# Patient Record
Sex: Male | Born: 1990
Health system: Southern US, Community
[De-identification: ages and names within clinical notes are randomized; demographics above are authoritative.]

## PROBLEM LIST (undated history)

## (undated) DIAGNOSIS — S0990XA Unspecified injury of head, initial encounter: Secondary | ICD-10-CM

## (undated) DIAGNOSIS — G43909 Migraine, unspecified, not intractable, without status migrainosus: Secondary | ICD-10-CM

---
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
## 2012-10-23 ENCOUNTER — Emergency Department (HOSPITAL_BASED_OUTPATIENT_CLINIC_OR_DEPARTMENT_OTHER)
Admission: EM | Admit: 2012-10-23 | Discharge: 2012-10-23 | Disposition: A | Discharge status: Home / Self Care | Payer: Self-pay | Attending: Emergency Medicine | Admitting: Emergency Medicine

## 2012-10-23 ENCOUNTER — Encounter (HOSPITAL_BASED_OUTPATIENT_CLINIC_OR_DEPARTMENT_OTHER): Payer: Self-pay | Admitting: *Deleted

## 2012-10-23 DIAGNOSIS — Z87891 Personal history of nicotine dependence: Secondary | ICD-10-CM | POA: Insufficient documentation

## 2012-10-23 DIAGNOSIS — R51 Headache: Secondary | ICD-10-CM | POA: Insufficient documentation

## 2012-10-23 DIAGNOSIS — Z8679 Personal history of other diseases of the circulatory system: Secondary | ICD-10-CM | POA: Insufficient documentation

## 2012-10-23 HISTORY — DX: Migraine, unspecified, not intractable, without status migrainosus: G43.909
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
## 2012-10-23 NOTE — ED Notes (Signed)
Pt states he has a long history of headaches, hx of migraines, for the last two weeks he has had a headache every day.  Denies a headache at this time.  Describes the pain as beginning in his neck and radiating over head into his eyes.  Excederin does not work.  Took Ibuprofen 800mg  with relief.

## 2012-10-23 NOTE — ED Notes (Signed)
Pt states when he has his headaches, he has a sensitivity to light. Pt states that they come and go. Sometimes he is able to go to sleep and the headaches goes away, however other times it does not. Pt wants to be evaluated to make sure nothing serious is going on with his head/brain.

## 2013-01-15 ENCOUNTER — Emergency Department (HOSPITAL_BASED_OUTPATIENT_CLINIC_OR_DEPARTMENT_OTHER)
Admission: EM | Admit: 2013-01-15 | Discharge: 2013-01-15 | Disposition: A | Discharge status: Home / Self Care | Payer: Self-pay | Attending: Emergency Medicine | Admitting: Emergency Medicine

## 2013-01-15 ENCOUNTER — Encounter (HOSPITAL_BASED_OUTPATIENT_CLINIC_OR_DEPARTMENT_OTHER): Payer: Self-pay | Admitting: *Deleted

## 2013-01-15 DIAGNOSIS — Z87891 Personal history of nicotine dependence: Secondary | ICD-10-CM | POA: Insufficient documentation

## 2013-01-15 DIAGNOSIS — X12XXXA Contact with other hot fluids, initial encounter: Secondary | ICD-10-CM | POA: Insufficient documentation

## 2013-01-15 DIAGNOSIS — Y939 Activity, unspecified: Secondary | ICD-10-CM | POA: Insufficient documentation

## 2013-01-15 DIAGNOSIS — T23271A Burn of second degree of right wrist, initial encounter: Secondary | ICD-10-CM

## 2013-01-15 DIAGNOSIS — Z8679 Personal history of other diseases of the circulatory system: Secondary | ICD-10-CM | POA: Insufficient documentation

## 2013-01-15 DIAGNOSIS — Y929 Unspecified place or not applicable: Secondary | ICD-10-CM | POA: Insufficient documentation

## 2013-01-15 DIAGNOSIS — T23279A Burn of second degree of unspecified wrist, initial encounter: Secondary | ICD-10-CM | POA: Insufficient documentation

## 2013-01-15 MED ORDER — HYDROCODONE-ACETAMINOPHEN 5-325 MG PO TABS: 2.0000 | ORAL_TABLET | ORAL | Status: DC | PRN

## 2013-01-15 MED ORDER — SILVER SULFADIAZINE 1 % EX CREA
TOPICAL_CREAM | Freq: Every day | CUTANEOUS | Status: DC
  Administered 2013-01-15: 17:00:00 via TOPICAL
  Filled 2013-01-15: qty 85
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
## 2013-01-15 NOTE — ED Provider Notes (Signed)
Medical screening examination/treatment/procedure(s) were performed by non-physician practitioner and as supervising physician I was immediately available for consultation/collaboration.   Andrian Urbach, MD 01/15/13 2322 
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
## 2013-01-16 ENCOUNTER — Emergency Department (HOSPITAL_BASED_OUTPATIENT_CLINIC_OR_DEPARTMENT_OTHER)
Admission: EM | Admit: 2013-01-16 | Discharge: 2013-01-16 | Disposition: A | Discharge status: Home / Self Care | Payer: Self-pay | Attending: Emergency Medicine | Admitting: Emergency Medicine

## 2013-01-16 ENCOUNTER — Encounter (HOSPITAL_BASED_OUTPATIENT_CLINIC_OR_DEPARTMENT_OTHER): Payer: Self-pay | Admitting: *Deleted

## 2013-01-16 DIAGNOSIS — X58XXXA Exposure to other specified factors, initial encounter: Secondary | ICD-10-CM | POA: Insufficient documentation

## 2013-01-16 DIAGNOSIS — Z87891 Personal history of nicotine dependence: Secondary | ICD-10-CM | POA: Insufficient documentation

## 2013-01-16 DIAGNOSIS — Y929 Unspecified place or not applicable: Secondary | ICD-10-CM | POA: Insufficient documentation

## 2013-01-16 DIAGNOSIS — Z8679 Personal history of other diseases of the circulatory system: Secondary | ICD-10-CM | POA: Insufficient documentation

## 2013-01-16 DIAGNOSIS — Y939 Activity, unspecified: Secondary | ICD-10-CM | POA: Insufficient documentation

## 2013-01-16 DIAGNOSIS — T23301S Burn of third degree of right hand, unspecified site, sequela: Secondary | ICD-10-CM

## 2013-01-16 DIAGNOSIS — T23309A Burn of third degree of unspecified hand, unspecified site, initial encounter: Secondary | ICD-10-CM | POA: Insufficient documentation

## 2013-01-16 MED ORDER — SILVER SULFADIAZINE 1 % EX CREA: TOPICAL_CREAM | Freq: Every day | CUTANEOUS | Status: DC
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
## 2013-01-21 ENCOUNTER — Encounter (HOSPITAL_BASED_OUTPATIENT_CLINIC_OR_DEPARTMENT_OTHER): Payer: Self-pay | Admitting: *Deleted

## 2013-01-21 ENCOUNTER — Emergency Department (HOSPITAL_BASED_OUTPATIENT_CLINIC_OR_DEPARTMENT_OTHER)
Admission: EM | Admit: 2013-01-21 | Discharge: 2013-01-21 | Disposition: A | Discharge status: Home / Self Care | Payer: Self-pay | Attending: Emergency Medicine | Admitting: Emergency Medicine

## 2013-01-21 DIAGNOSIS — T300 Burn of unspecified body region, unspecified degree: Secondary | ICD-10-CM

## 2013-01-21 DIAGNOSIS — X19XXXA Contact with other heat and hot substances, initial encounter: Secondary | ICD-10-CM | POA: Insufficient documentation

## 2013-01-21 DIAGNOSIS — T22219A Burn of second degree of unspecified forearm, initial encounter: Secondary | ICD-10-CM | POA: Insufficient documentation

## 2013-01-21 DIAGNOSIS — Y9389 Activity, other specified: Secondary | ICD-10-CM | POA: Insufficient documentation

## 2013-01-21 DIAGNOSIS — Y929 Unspecified place or not applicable: Secondary | ICD-10-CM | POA: Insufficient documentation

## 2013-01-21 DIAGNOSIS — Z87891 Personal history of nicotine dependence: Secondary | ICD-10-CM | POA: Insufficient documentation

## 2013-01-21 DIAGNOSIS — T310 Burns involving less than 10% of body surface: Secondary | ICD-10-CM | POA: Insufficient documentation

## 2013-01-21 DIAGNOSIS — Z8679 Personal history of other diseases of the circulatory system: Secondary | ICD-10-CM | POA: Insufficient documentation

## 2013-01-21 MED ORDER — SILVER SULFADIAZINE 1 % EX CREA
TOPICAL_CREAM | Freq: Once | CUTANEOUS | Status: DC
  Filled 2013-01-21: qty 85

## 2013-01-21 MED ORDER — HYDROCODONE-ACETAMINOPHEN 5-325 MG PO TABS: ORAL_TABLET | ORAL | Status: DC
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
## 2013-01-21 NOTE — ED Provider Notes (Signed)
  Medical screening examination/treatment/procedure(s) were performed by non-physician practitioner and as supervising physician I was immediately available for consultation/collaboration.    Gerhard Munch, MD 01/21/13 1447

## 2013-05-18 ENCOUNTER — Encounter (HOSPITAL_BASED_OUTPATIENT_CLINIC_OR_DEPARTMENT_OTHER): Payer: Self-pay | Admitting: Emergency Medicine

## 2013-05-18 ENCOUNTER — Emergency Department (HOSPITAL_BASED_OUTPATIENT_CLINIC_OR_DEPARTMENT_OTHER)
Admission: EM | Admit: 2013-05-18 | Discharge: 2013-05-18 | Disposition: A | Discharge status: Home / Self Care | Payer: Self-pay | Attending: Emergency Medicine | Admitting: Emergency Medicine

## 2013-05-18 DIAGNOSIS — M542 Cervicalgia: Secondary | ICD-10-CM | POA: Insufficient documentation

## 2013-05-18 DIAGNOSIS — F17200 Nicotine dependence, unspecified, uncomplicated: Secondary | ICD-10-CM | POA: Insufficient documentation

## 2013-05-18 DIAGNOSIS — Z8679 Personal history of other diseases of the circulatory system: Secondary | ICD-10-CM | POA: Insufficient documentation

## 2013-05-18 MED ORDER — CYCLOBENZAPRINE HCL 10 MG PO TABS: 10.0000 mg | ORAL_TABLET | Freq: Two times a day (BID) | ORAL | Status: DC | PRN

## 2013-05-18 MED ORDER — IBUPROFEN 800 MG PO TABS: 800.0000 mg | ORAL_TABLET | Freq: Three times a day (TID) | ORAL | Status: DC

## 2013-05-18 NOTE — ED Provider Notes (Signed)
Medical screening examination/treatment/procedure(s) were performed by non-physician practitioner and as supervising physician I was immediately available for consultation/collaboration.  EKG Interpretation   None         Casy Brunetto B. Bernette Mayers, MD 05/18/13 [phone]

## 2013-05-18 NOTE — ED Provider Notes (Signed)
CSN: [id number]     Arrival date & time 05/18/13  1138 History   First MD Initiated Contact with Patient 05/18/13 1156     Chief Complaint  Patient presents with  . Fever   (Consider location/radiation/quality/duration/timing/severity/associated sxs/prior Treatment) HPI Comments: He presents for evaluation of neck pain that started 3 days ago. He reports having a fever 3 days ago but none since. No nausea, vomiting, diarrhea. He is not aware of any injury to the neck. He has a history of recurrent migraine headaches but that current symptoms are not like his headache. Other than neck discomfort, he has no headache, sinus congestion, sore throat or cough.  The history is provided by the patient. No language interpreter was used.    Past Medical History  Diagnosis Date  . Migraine    History reviewed. No pertinent past surgical history. No family history on file. History  Substance Use Topics  . Smoking status: Current Every Day Smoker  . Smokeless tobacco: Never Used  . Alcohol Use: No    Review of Systems  Constitutional: Positive for fever.  HENT: Negative for congestion.   Eyes: Negative for visual disturbance.  Respiratory: Negative for cough and shortness of breath.   Cardiovascular: Negative for chest pain.  Gastrointestinal: Negative for nausea and vomiting.  Musculoskeletal: Positive for neck pain. Negative for myalgias.  Skin: Negative for rash.  Neurological: Negative for headaches.    Allergies  Review of patient's allergies indicates no known allergies.  Home Medications   Current Outpatient Rx  Name  Route  Sig  Dispense  Refill  . HYDROcodone-acetaminophen (NORCO/VICODIN) 5-325 MG per tablet      Take 1-2 tablets every 6 hours as needed for severe pain   8 tablet   0   . ibuprofen (ADVIL,MOTRIN) 800 MG tablet   Oral   Take 800 mg by mouth every 8 (eight) hours as needed for pain.         . silver sulfADIAZINE (SILVADENE) 1 % cream   Topical  Apply topically daily.   50 g   0    BP 119/75  Pulse 74  Temp(Src) 98.7 F (37.1 C) (Oral)  Resp 16  Ht 5\' 5"  (1.651 m)  Wt 165 lb (74.844 kg)  BMI 27.46 kg/m2  SpO2 100% Physical Exam  Constitutional: He is oriented to person, place, and time. He appears well-developed and well-nourished.  HENT:  Head: Normocephalic and atraumatic.  Eyes: EOM are normal. Pupils are equal, round, and reactive to light.  Neck: Normal range of motion.  Cardiovascular: Normal rate and regular rhythm.   No murmur heard. Pulmonary/Chest: Effort normal and breath sounds normal. He has no wheezes. He has no rales.  Abdominal: Soft. There is no tenderness.  Musculoskeletal:  Midline, lower cervical tenderness without paracervical tenderness or swelling. FROM.   Neurological: He is alert and oriented to person, place, and time. He has normal strength and normal reflexes. No sensory deficit. He displays a negative Romberg sign.  No meningeal signs.  Skin: Skin is warm and dry.  Psychiatric: He has a normal mood and affect.    ED Course  Procedures (including critical care time) Labs Review Labs Reviewed - No data to display Imaging Review No results found.  EKG Interpretation   None       MDM  No diagnosis found. 1. Muscular neck pain  He has reproducible tenderness to neck and no meningeal signs or signs of illness. VSS. Stable for discharge.  Arnoldo Hooker, PA-C 05/18/13 1235

## 2013-05-18 NOTE — ED Notes (Signed)
Intermittent fever, HA, neck pain since 11/15

## 2014-07-19 ENCOUNTER — Emergency Department (HOSPITAL_BASED_OUTPATIENT_CLINIC_OR_DEPARTMENT_OTHER)
Admission: EM | Admit: 2014-07-19 | Discharge: 2014-07-20 | Disposition: A | Discharge status: Home / Self Care | Payer: Self-pay | Attending: Emergency Medicine | Admitting: Emergency Medicine

## 2014-07-19 ENCOUNTER — Encounter (HOSPITAL_BASED_OUTPATIENT_CLINIC_OR_DEPARTMENT_OTHER): Payer: Self-pay | Admitting: Emergency Medicine

## 2014-07-19 DIAGNOSIS — Z794 Long term (current) use of insulin: Secondary | ICD-10-CM | POA: Insufficient documentation

## 2014-07-19 DIAGNOSIS — Z87828 Personal history of other (healed) physical injury and trauma: Secondary | ICD-10-CM | POA: Insufficient documentation

## 2014-07-19 DIAGNOSIS — Z79899 Other long term (current) drug therapy: Secondary | ICD-10-CM | POA: Insufficient documentation

## 2014-07-19 DIAGNOSIS — G43909 Migraine, unspecified, not intractable, without status migrainosus: Secondary | ICD-10-CM | POA: Insufficient documentation

## 2014-07-19 DIAGNOSIS — Z791 Long term (current) use of non-steroidal anti-inflammatories (NSAID): Secondary | ICD-10-CM | POA: Insufficient documentation

## 2014-07-19 DIAGNOSIS — G2571 Drug induced akathisia: Secondary | ICD-10-CM | POA: Insufficient documentation

## 2014-07-19 DIAGNOSIS — Z720 Tobacco use: Secondary | ICD-10-CM | POA: Insufficient documentation

## 2014-07-19 DIAGNOSIS — T450X5A Adverse effect of antiallergic and antiemetic drugs, initial encounter: Secondary | ICD-10-CM | POA: Insufficient documentation

## 2014-07-19 HISTORY — DX: Unspecified injury of head, initial encounter: S09.90XA

## 2014-07-19 MED ORDER — SODIUM CHLORIDE 0.9 % IV BOLUS (SEPSIS)
1000.0000 mL | Freq: Once | INTRAVENOUS | Status: AC
  Administered 2014-07-19: 1000 mL via INTRAVENOUS

## 2014-07-19 MED ORDER — KETOROLAC TROMETHAMINE 30 MG/ML IJ SOLN
30.0000 mg | Freq: Once | INTRAMUSCULAR | Status: AC
  Administered 2014-07-19: 30 mg via INTRAVENOUS
  Filled 2014-07-19: qty 1

## 2014-07-19 MED ORDER — NAPROXEN 500 MG PO TABS: 500.0000 mg | ORAL_TABLET | Freq: Two times a day (BID) | ORAL | Status: DC | PRN

## 2014-07-19 MED ORDER — DIPHENHYDRAMINE HCL 25 MG PO TABS: 25.0000 mg | ORAL_TABLET | Freq: Every evening | ORAL | Status: DC | PRN

## 2014-07-19 MED ORDER — ONDANSETRON 4 MG PO TBDP: 4.0000 mg | ORAL_TABLET | Freq: Three times a day (TID) | ORAL | Status: DC | PRN

## 2014-07-19 MED ORDER — LORAZEPAM 2 MG/ML IJ SOLN
1.0000 mg | INTRAMUSCULAR | Status: DC | PRN
  Administered 2014-07-19: 1 mg via INTRAVENOUS
  Filled 2014-07-19: qty 1

## 2014-07-19 MED ORDER — METOCLOPRAMIDE HCL 5 MG/ML IJ SOLN
10.0000 mg | Freq: Once | INTRAMUSCULAR | Status: AC
  Administered 2014-07-19: 10 mg via INTRAVENOUS
  Filled 2014-07-19: qty 2

## 2014-07-19 MED ORDER — DIPHENHYDRAMINE HCL 50 MG/ML IJ SOLN
25.0000 mg | Freq: Once | INTRAMUSCULAR | Status: AC
  Administered 2014-07-19: 25 mg via INTRAVENOUS
  Filled 2014-07-19: qty 1

## 2014-07-19 NOTE — Discharge Instructions (Signed)

## 2014-07-19 NOTE — ED Provider Notes (Signed)
CSN: [id number]     Arrival date & time 07/19/14  2133 History  This chart was scribed for Rolland Porter, MD by Evon Slack, ED Scribe. This patient was seen in room MH12/MH12 and the patient's care was started at 10:11 PM.     Chief Complaint  Patient presents with  . vomiting    . Headache   Patient is a 24 y.o. male presenting with headaches. The history is provided by the patient. No language interpreter was used.  Headache Associated symptoms: no abdominal pain, no cough, no diarrhea, no dizziness, no fatigue, no fever, no nausea, no sore throat and no vomiting    HPI Comments: Walter Owens is a 24 y.o. male with PMHx of migraine and Head injury who presents to the Emergency Department complaining of recurrent sharp throbbing HA onset today at 2 Pm. Pt states he has associated nausea, vomiting and neck stiffness. Pt states that he began vomiting after eating some meatloaf. Pt states that he tried taking a nap and benadryl that provided no relief. Pt denies fever, sore throat or cough  Past Medical History  Diagnosis Date  . Migraine   . Head injury    History reviewed. No pertinent past surgical history. History reviewed. No pertinent family history. History  Substance Use Topics  . Smoking status: Current Every Day Smoker  . Smokeless tobacco: Never Used  . Alcohol Use: No    Review of Systems  Constitutional: Negative for fever, chills, diaphoresis, appetite change and fatigue.  HENT: Negative for mouth sores, sore throat and trouble swallowing.   Eyes: Negative for visual disturbance.  Respiratory: Negative for cough, chest tightness, shortness of breath and wheezing.   Cardiovascular: Negative for chest pain.  Gastrointestinal: Negative for nausea, vomiting, abdominal pain, diarrhea and abdominal distention.  Endocrine: Negative for polydipsia, polyphagia and polyuria.  Genitourinary: Negative for dysuria, frequency and hematuria.  Musculoskeletal: Negative for  gait problem.  Skin: Negative for color change, pallor and rash.  Neurological: Positive for headaches. Negative for dizziness, syncope and light-headedness.  Hematological: Does not bruise/bleed easily.  Psychiatric/Behavioral: Negative for behavioral problems and confusion.      Allergies  Review of patient's allergies indicates no known allergies.  Home Medications   Prior to Admission medications   Medication Sig Start Date End Date Taking? Authorizing Provider  cyclobenzaprine (FLEXERIL) 10 MG tablet Take 1 tablet (10 mg total) by mouth 2 (two) times daily as needed for muscle spasms. 05/18/13   Shari A Upstill, PA-C  diphenhydrAMINE (BENADRYL) 25 MG tablet Take 1 tablet (25 mg total) by mouth at bedtime as needed (With Zofran and naproxen as needed for migraine). 07/19/14   Rolland Porter, MD  HYDROcodone-acetaminophen (NORCO/VICODIN) 5-325 MG per tablet Take 1-2 tablets every 6 hours as needed for severe pain 01/21/13   Renne Crigler, PA-C  ibuprofen (ADVIL,MOTRIN) 800 MG tablet Take 800 mg by mouth every 8 (eight) hours as needed for pain.    Historical Provider, MD  ibuprofen (ADVIL,MOTRIN) 800 MG tablet Take 1 tablet (800 mg total) by mouth 3 (three) times daily. 05/18/13   Shari A Upstill, PA-C  naproxen (NAPROSYN) 500 MG tablet Take 1 tablet (500 mg total) by mouth 2 (two) times daily as needed (with benadryl and zofran for migraine). 07/19/14   Rolland Porter, MD  ondansetron (ZOFRAN ODT) 4 MG disintegrating tablet Take 1 tablet (4 mg total) by mouth every 8 (eight) hours as needed (With naproxen and Benadryl as needed for migraine). 07/19/14  Rolland PorterMark Nelwyn Hebdon, MD  silver sulfADIAZINE (SILVADENE) 1 % cream Apply topically daily. 01/16/13   April K Palumbo-Rasch, MD   BP 121/53 mmHg  Pulse 51  Temp(Src) 98 F (36.7 C) (Oral)  Resp 18  Ht 5\' 5"  (1.651 m)  Wt 160 lb (72.576 kg)  BMI 26.63 kg/m2  SpO2 100%   Physical Exam  Constitutional: He is oriented to person, place, and time. He  appears well-developed and well-nourished. No distress.  HENT:  Head: Normocephalic.  Mouth/Throat: Oropharynx is clear and moist.  Eyes: Conjunctivae are normal. Pupils are equal, round, and reactive to light. No scleral icterus.  Neck: Normal range of motion. Neck supple. No thyromegaly present.  Cardiovascular: Normal rate and regular rhythm.  Exam reveals no gallop and no friction rub.   No murmur heard. Pulmonary/Chest: Effort normal and breath sounds normal. No respiratory distress. He has no wheezes. He has no rales.  Abdominal: Soft. Bowel sounds are normal. He exhibits no distension. There is no tenderness. There is no rebound.  Musculoskeletal: Normal range of motion.  Neurological: He is alert and oriented to person, place, and time.  Skin: Skin is warm and dry. No rash noted.  Psychiatric: He has a normal mood and affect. His behavior is normal.    ED Course  Procedures (including critical care time) DIAGNOSTIC STUDIES: Oxygen Saturation is 100% on RA, normal by my interpretation.    COORDINATION OF CARE: 10:37 PM-Discussed treatment plan with pt at bedside and pt agreed to plan.     Labs Review Labs Reviewed - No data to display  Imaging Review No results found.   EKG Interpretation None      MDM   Final diagnoses:  Migraine without status migrainosus, not intractable, unspecified migraine type  Drug induced akathisia      Patient had an akathisia reaction to the Reglan despite beginning with Benadryl. Stated that his feet were restless and tingling.  The into the room is sitting on image the bed marching his feet up and down on the floor. Given IV Ativan. His headache is improving.  Will observe him for a short time to make sure he remains relatively comfortable.  Not concerned that this is a headache secondary to hemorrhage tumor or meningitis. He is afebrile. Has a supple neck. His otherwise young healthy with history of migraines.     Rolland PorterMark  Magie Ciampa, MD 07/19/14 [phone]

## 2014-07-19 NOTE — ED Notes (Signed)
Pt states he has thrown up 3 times starting tonight
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
## 2015-01-11 ENCOUNTER — Encounter (HOSPITAL_BASED_OUTPATIENT_CLINIC_OR_DEPARTMENT_OTHER): Payer: Self-pay

## 2015-01-11 ENCOUNTER — Emergency Department (HOSPITAL_BASED_OUTPATIENT_CLINIC_OR_DEPARTMENT_OTHER)
Admission: EM | Admit: 2015-01-11 | Discharge: 2015-01-11 | Disposition: A | Discharge status: Home / Self Care | Payer: Self-pay | Attending: Emergency Medicine | Admitting: Emergency Medicine

## 2015-01-11 DIAGNOSIS — B9789 Other viral agents as the cause of diseases classified elsewhere: Secondary | ICD-10-CM

## 2015-01-11 DIAGNOSIS — J029 Acute pharyngitis, unspecified: Secondary | ICD-10-CM | POA: Insufficient documentation

## 2015-01-11 DIAGNOSIS — J028 Acute pharyngitis due to other specified organisms: Secondary | ICD-10-CM

## 2015-01-11 DIAGNOSIS — Z8679 Personal history of other diseases of the circulatory system: Secondary | ICD-10-CM | POA: Insufficient documentation

## 2015-01-11 DIAGNOSIS — Z87828 Personal history of other (healed) physical injury and trauma: Secondary | ICD-10-CM | POA: Insufficient documentation

## 2015-01-11 DIAGNOSIS — Z720 Tobacco use: Secondary | ICD-10-CM | POA: Insufficient documentation

## 2015-01-11 LAB — RAPID STREP SCREEN (MED CTR MEBANE ONLY): Streptococcus, Group A Screen (Direct): NEGATIVE

## 2015-01-11 MED ORDER — IBUPROFEN 600 MG PO TABS: 600.0000 mg | ORAL_TABLET | Freq: Four times a day (QID) | ORAL | Status: DC | PRN

## 2015-01-11 MED ORDER — IBUPROFEN 800 MG PO TABS
800.0000 mg | ORAL_TABLET | Freq: Once | ORAL | Status: AC
  Administered 2015-01-11: 800 mg via ORAL
  Filled 2015-01-11: qty 1

## 2015-01-11 NOTE — ED Provider Notes (Signed)
CSN: [id number]     Arrival date & time 01/11/15  2022 History  This chart was scribed for Richardean Canalavid H Yao, MD by Chestine SporeSoijett Blue, ED Scribe. The patient was seen in room MH06/MH06 at 8:33 PM.     Chief Complaint  Patient presents with  . Sore Throat      The history is provided by the patient. No language interpreter was used.    HPI Comments: Walter Owens is a 24 y.o. male who presents to the Emergency Department complaining of sore throat onset 2 days. He states that he is having associated symptoms of fever, chills, and cough. He states that he has not tried any medications for the relief for his symptoms. He denies n/v, trouble swallowing, and any other symptoms. Pt denies having strep throat as far as he can remember. Pt denies any sick contacts.   Past Medical History  Diagnosis Date  . Migraine   . Head injury    History reviewed. No pertinent past surgical history. No family history on file. History  Substance Use Topics  . Smoking status: Current Every Day Smoker  . Smokeless tobacco: Never Used  . Alcohol Use: No    Review of Systems  Constitutional: Positive for fever and chills.  HENT: Positive for sore throat. Negative for trouble swallowing.   Respiratory: Positive for cough.   All other systems reviewed and are negative.     Allergies  Review of patient's allergies indicates no known allergies.  Home Medications   Prior to Admission medications   Not on File   BP 114/66 mmHg  Pulse 81  Temp(Src) 99 F (37.2 C) (Oral)  Resp 18  Ht 5\' 5"  (1.651 m)  Wt 165 lb (74.844 kg)  BMI 27.46 kg/m2  SpO2 100% Physical Exam  Constitutional: He is oriented to person, place, and time.  HENT:  Mouth/Throat: Oropharyngeal exudate and posterior oropharyngeal erythema present.  Tonsils 1+ enlarged and minimally red. Mild exudates.  Cardiovascular: Normal rate, regular rhythm and normal heart sounds.  Exam reveals no gallop and no friction rub.   No murmur  heard. Pulmonary/Chest: Effort normal and breath sounds normal. No respiratory distress. He has no wheezes. He has no rales.  Abdominal: Soft.  Musculoskeletal: Normal range of motion.  Lymphadenopathy:    He has cervical adenopathy.  Mild cervical LAD  Neurological: He is alert and oriented to person, place, and time.  Skin: Skin is warm.  Psychiatric: He has a normal mood and affect. His behavior is normal. Judgment and thought content normal.  Nursing note and vitals reviewed.   ED Course  Procedures (including critical care time) DIAGNOSTIC STUDIES: Oxygen Saturation is 100% on RA, nl by my interpretation.    COORDINATION OF CARE: 8:35 PM-Discussed treatment plan with pt at bedside and pt agreed to plan.   Labs Review Labs Reviewed  RAPID STREP SCREEN (NOT AT [facility])  CULTURE, GROUP A STREP    Imaging Review No results found.   EKG Interpretation None      MDM   Final diagnoses:  None    Walter Owens is a 24 y.o. male here with sore throat. Will get rapid strep, if neg, will not give abx.   8:51 PM Rapid strep neg. Given motrin. Likely viral. Will dc home.    I personally performed the services described in this documentation, which was scribed in my presence. The recorded information has been reviewed and is accurate.   Richardean Canalavid H Yao, MD 01/11/15  2052 

## 2015-01-11 NOTE — ED Notes (Signed)
Pt c/o sore throat and difficulty swallowing denies SOB or breathing difficutly

## 2015-01-11 NOTE — ED Notes (Signed)
Sore throat x 2 days

## 2015-01-11 NOTE — Discharge Instructions (Signed)
Take motrin for pain or fever.   Stay hydrated.   Follow up with your doctor.   Return to ER if you have worse fever, sore throat, trouble swallowing.

## 2015-01-15 ENCOUNTER — Emergency Department (HOSPITAL_COMMUNITY)
Admission: EM | Admit: 2015-01-15 | Discharge: 2015-01-15 | Disposition: A | Discharge status: Home / Self Care | Payer: Self-pay | Attending: Emergency Medicine | Admitting: Emergency Medicine

## 2015-01-15 ENCOUNTER — Encounter (HOSPITAL_COMMUNITY): Payer: Self-pay | Admitting: Emergency Medicine

## 2015-01-15 DIAGNOSIS — J039 Acute tonsillitis, unspecified: Secondary | ICD-10-CM | POA: Insufficient documentation

## 2015-01-15 DIAGNOSIS — Z87828 Personal history of other (healed) physical injury and trauma: Secondary | ICD-10-CM | POA: Insufficient documentation

## 2015-01-15 DIAGNOSIS — Z8679 Personal history of other diseases of the circulatory system: Secondary | ICD-10-CM | POA: Insufficient documentation

## 2015-01-15 DIAGNOSIS — Z720 Tobacco use: Secondary | ICD-10-CM | POA: Insufficient documentation

## 2015-01-15 DIAGNOSIS — R630 Anorexia: Secondary | ICD-10-CM | POA: Insufficient documentation

## 2015-01-15 LAB — CULTURE, GROUP A STREP

## 2015-01-15 MED ORDER — HYDROCODONE-ACETAMINOPHEN 5-325 MG PO TABS: 1.0000 | ORAL_TABLET | ORAL | Status: DC | PRN

## 2015-01-15 MED ORDER — AMOXICILLIN 500 MG PO CAPS
500.0000 mg | ORAL_CAPSULE | Freq: Once | ORAL | Status: AC
  Administered 2015-01-15: 500 mg via ORAL
  Filled 2015-01-15: qty 1

## 2015-01-15 MED ORDER — OXYCODONE-ACETAMINOPHEN 5-325 MG PO TABS
1.0000 | ORAL_TABLET | Freq: Once | ORAL | Status: AC
  Administered 2015-01-15: 1 via ORAL
  Filled 2015-01-15: qty 1

## 2015-01-15 MED ORDER — AMOXICILLIN 500 MG PO CAPS: 500.0000 mg | ORAL_CAPSULE | Freq: Three times a day (TID) | ORAL | Status: DC

## 2015-01-15 NOTE — Discharge Instructions (Signed)

## 2015-01-15 NOTE — ED Notes (Signed)
Patient is alert and orientedx4.  Patient was explained discharge instructions and they understood them with no questions.  The patient's fiancee, Kenard GowerDominique Crouch is taking the patient home.

## 2015-01-15 NOTE — ED Notes (Signed)
The patient said he has had a sore throat for six days.  [facility] was [facility] [place] and was told it was a virus and it would go away.  It has gotten worse and the ibuprofen is not working.  He rates his pain 10/10.

## 2015-01-15 NOTE — ED Provider Notes (Signed)
CSN: [id number]     Arrival date & time 01/15/15  0840 History  This chart was scribed for Marlon Pel, PA-C, working with Nelva Nay, MD by Chestine Spore, ED Scribe. The patient was seen in room TR08C/TR08C at 9:02 AM.     Chief Complaint  Patient presents with  . Sore Throat    The patient said he has had a sore throat for six days.  He was seen at [facility] [place] and was told it was a virus and it would go away.       The history is provided by the patient. No language interpreter was used.    HPI Comments: Walter Owens is a 24 y.o. male who presents to the Emergency Department complaining of worsening sore throat onset 6 days. Pt was seen 6 days ago at [facility] and was informed that he had a negative strep and was d/c with ibuprofen. Pt reports that his sore throat is worse on the left side with mild pain on the right side. Pt reports that there is pain with swallowing but he is able and is not drooling. Pt denies having any anti-septic spray for his throat. He states that he is having associated symptoms of appetite change due to sore throat. He denies fever, color change, wound, and any other symptoms.  PCP: No PCP Per Patient Blood pressure 107/70, pulse 76, temperature 98.4 F (36.9 C), temperature source Oral, resp. rate 16, SpO2 99 %.   The patient denies diaphoresis, fever, headache, weakness (general or focal), confusion, change of vision,  neck pain, dysphagia, aphagia, chest pain, shortness of breath,  back pain, abdominal pains, nausea, vomiting, diarrhea, lower extremity swelling, rash.  Past Medical History  Diagnosis Date  . Migraine   . Head injury    History reviewed. No pertinent past surgical history. History reviewed. No pertinent family history. History  Substance Use Topics  . Smoking status: Current Every Day Smoker  . Smokeless tobacco: Never Used  . Alcohol Use: No    Review of Systems  Constitutional: Negative for fever.  HENT: Positive  for sore throat. Negative for trouble swallowing.   Skin: Negative for color change, rash and wound.      Allergies  Review of patient's allergies indicates no known allergies.  Home Medications   Prior to Admission medications   Medication Sig Start Date End Date Taking? Authorizing Provider  amoxicillin (AMOXIL) 500 MG capsule Take 1 capsule (500 mg total) by mouth 3 (three) times daily. 01/15/15   Marlon Pel, PA-C  HYDROcodone-acetaminophen (NORCO/VICODIN) 5-325 MG per tablet Take 1 tablet by mouth every 4 (four) hours as needed. 01/15/15   Tuan Tippin Neva Seat, PA-C  ibuprofen (ADVIL,MOTRIN) 600 MG tablet Take 1 tablet (600 mg total) by mouth every 6 (six) hours as needed. 01/11/15   Richardean Canal, MD   BP 107/70 mmHg  Pulse 76  Temp(Src) 98.4 F (36.9 C) (Oral)  Resp 16  SpO2 99% Physical Exam  Constitutional: He is oriented to person, place, and time. He appears well-developed and well-nourished. No distress.  HENT:  Head: Normocephalic and atraumatic.  Right Ear: Tympanic membrane, external ear and ear canal normal.  Left Ear: Tympanic membrane, external ear and ear canal normal.  Nose: Nose normal. No rhinorrhea. Right sinus exhibits no maxillary sinus tenderness and no frontal sinus tenderness. Left sinus exhibits no maxillary sinus tenderness and no frontal sinus tenderness.  Mouth/Throat: Uvula is midline and mucous membranes are normal. No trismus in  the jaw. Normal dentition. No dental abscesses or uvula swelling. Oropharyngeal exudate and posterior oropharyngeal erythema present. No posterior oropharyngeal edema or tonsillar abscesses.  No submental edema, tongue not elevated, no trismus. No impending airway obstruction; Pt able to speak full sentences, swallow intact, no drooling, stridor, or tonsillar/uvula displacement. No palatal petechia  Eyes: Conjunctivae and EOM are normal.  Neck: Trachea normal, normal range of motion and full passive range of motion without pain.  Neck supple. No rigidity. No tracheal deviation and normal range of motion present. No Brudzinski's sign noted.  Flexion and extension of neck without pain or difficulty. Able to breath without difficulty in extension.  Cardiovascular: Normal rate and regular rhythm.   Pulmonary/Chest: Effort normal and breath sounds normal. No stridor. No respiratory distress. He has no wheezes.  Abdominal: Soft. There is no tenderness.  No obvious evidence of splenomegaly. Non ttp.   Musculoskeletal: Normal range of motion.  Lymphadenopathy:       Head (right side): No preauricular and no posterior auricular adenopathy present.       Head (left side): No preauricular and no posterior auricular adenopathy present.    He has cervical adenopathy.  Neurological: He is alert and oriented to person, place, and time.  Skin: Skin is warm and dry. No rash noted. He is not diaphoretic.  Psychiatric: He has a normal mood and affect. His behavior is normal.  Nursing note and vitals reviewed.   ED Course  Procedures (including critical care time) DIAGNOSTIC STUDIES: Oxygen Saturation is 99% on RA, nl by my interpretation.    COORDINATION OF CARE: 9:06 AM-Discussed treatment plan which includes abx Rx with pt at bedside and pt agreed to plan.   Labs Review Labs Reviewed - No data to display  Imaging Review No results found.   EKG Interpretation None      MDM   Final diagnoses:  Tonsillitis    Take antibiotic in completion. Continue to stay well-hydrated. Gargle warm salt water and spit it out. Continued to alternate between Tylenol and ibuprofen for pain. May consider over-the-counter Benadryl for additional relief. Followup with your primary care doctor in 5-7 days for recheck of ongoing symptoms that return to emergency department for emergent changing or worsening of symptoms.  Medications  amoxicillin (AMOXIL) capsule 500 mg (not administered)  oxyCODONE-acetaminophen (PERCOCET/ROXICET) 5-325 MG  per tablet 1 tablet (not administered)    23 y.o.Walter Owens's evaluation in the Emergency Department is complete. It has been determined that no acute conditions requiring further emergency intervention are present at this time. The patient/guardian have been advised of the diagnosis and plan. We have discussed signs and symptoms that warrant return to the ED, such as changes or worsening in symptoms.  Vital signs are stable at discharge. Filed Vitals:   01/15/15 0855  BP: 107/70  Pulse: 76  Temp: 98.4 F (36.9 C)  Resp: 16    Patient/guardian has voiced understanding and agreed to follow-up with the PCP or specialist.   I personally performed the services described in this documentation, which was scribed in my presence. The recorded information has been reviewed and is accurate.   Marlon Peliffany June Rode, PA-C 01/15/15 [id number]  Nelva Nayobert Beaton, MD 01/16/15 [phone]

## 2015-01-17 ENCOUNTER — Emergency Department (HOSPITAL_COMMUNITY): Payer: Self-pay

## 2015-01-17 ENCOUNTER — Encounter (HOSPITAL_COMMUNITY): Payer: Self-pay | Admitting: Emergency Medicine

## 2015-01-17 ENCOUNTER — Emergency Department (HOSPITAL_COMMUNITY)
Admission: EM | Admit: 2015-01-17 | Discharge: 2015-01-17 | Disposition: A | Discharge status: Home / Self Care | Payer: Self-pay | Attending: Emergency Medicine | Admitting: Emergency Medicine

## 2015-01-17 DIAGNOSIS — J36 Peritonsillar abscess: Secondary | ICD-10-CM | POA: Insufficient documentation

## 2015-01-17 DIAGNOSIS — Z87828 Personal history of other (healed) physical injury and trauma: Secondary | ICD-10-CM | POA: Insufficient documentation

## 2015-01-17 DIAGNOSIS — R509 Fever, unspecified: Secondary | ICD-10-CM | POA: Insufficient documentation

## 2015-01-17 DIAGNOSIS — Z792 Long term (current) use of antibiotics: Secondary | ICD-10-CM | POA: Insufficient documentation

## 2015-01-17 DIAGNOSIS — Z8679 Personal history of other diseases of the circulatory system: Secondary | ICD-10-CM | POA: Insufficient documentation

## 2015-01-17 DIAGNOSIS — Z720 Tobacco use: Secondary | ICD-10-CM | POA: Insufficient documentation

## 2015-01-17 LAB — CBC WITH DIFFERENTIAL/PLATELET
Basophils Absolute: 0.1 10*3/uL (ref 0.0–0.1)
Basophils Relative: 1 % (ref 0–1)
Eosinophils Absolute: 0.1 10*3/uL (ref 0.0–0.7)
Eosinophils Relative: 1 % (ref 0–5)
HEMATOCRIT: 39.3 % (ref 39.0–52.0)
Hemoglobin: 14 g/dL (ref 13.0–17.0)
LYMPHS PCT: 31 % (ref 12–46)
Lymphs Abs: 2.7 10*3/uL (ref 0.7–4.0)
MCH: 30 pg (ref 26.0–34.0)
MCHC: 35.6 g/dL (ref 30.0–36.0)
MCV: 84.2 fL (ref 78.0–100.0)
Monocytes Absolute: 1 10*3/uL (ref 0.1–1.0)
Monocytes Relative: 11 % (ref 3–12)
NEUTROS ABS: 4.9 10*3/uL (ref 1.7–7.7)
Neutrophils Relative %: 56 % (ref 43–77)
Platelets: 218 10*3/uL (ref 150–400)
RBC: 4.67 MIL/uL (ref 4.22–5.81)
RDW: 12.1 % (ref 11.5–15.5)
WBC: 8.8 10*3/uL (ref 4.0–10.5)

## 2015-01-17 LAB — BASIC METABOLIC PANEL
ANION GAP: 8 (ref 5–15)
BUN: 9 mg/dL (ref 6–20)
CALCIUM: 9.4 mg/dL (ref 8.9–10.3)
CO2: 29 mmol/L (ref 22–32)
CREATININE: 0.85 mg/dL (ref 0.61–1.24)
Chloride: 102 mmol/L (ref 101–111)
GFR calc Af Amer: >60 mL/min (ref >60)
Glucose, Bld: 107 mg/dL — ABNORMAL HIGH (ref 65–99)
Potassium: 3.5 mmol/L (ref 3.5–5.1)
SODIUM: 139 mmol/L (ref 135–145)

## 2015-01-17 LAB — RAPID STREP SCREEN (MED CTR MEBANE ONLY): STREPTOCOCCUS, GROUP A SCREEN (DIRECT): NEGATIVE

## 2015-01-17 MED ORDER — KETOROLAC TROMETHAMINE 30 MG/ML IJ SOLN
30.0000 mg | Freq: Once | INTRAMUSCULAR | Status: AC
  Administered 2015-01-17: 30 mg via INTRAVENOUS
  Filled 2015-01-17: qty 1

## 2015-01-17 MED ORDER — CLINDAMYCIN HCL 150 MG PO CAPS
300.0000 mg | ORAL_CAPSULE | Freq: Once | ORAL | Status: AC
  Administered 2015-01-17: 300 mg via ORAL
  Filled 2015-01-17: qty 2

## 2015-01-17 MED ORDER — HYDROCODONE-ACETAMINOPHEN 7.5-325 MG/15ML PO SOLN: 15.0000 mL | Freq: Four times a day (QID) | ORAL | Status: DC | PRN

## 2015-01-17 MED ORDER — CLINDAMYCIN HCL 150 MG PO CAPS: 300.0000 mg | ORAL_CAPSULE | Freq: Four times a day (QID) | ORAL | Status: DC

## 2015-01-17 MED ORDER — METHYLPREDNISOLONE SODIUM SUCC 125 MG IJ SOLR
40.0000 mg | Freq: Once | INTRAMUSCULAR | Status: AC
  Administered 2015-01-17: 40 mg via INTRAVENOUS
  Filled 2015-01-17: qty 2

## 2015-01-17 MED ORDER — IOHEXOL 300 MG/ML  SOLN
75.0000 mL | Freq: Once | INTRAMUSCULAR | Status: AC | PRN
  Administered 2015-01-17: 100 mL via INTRAVENOUS

## 2015-01-17 NOTE — Discharge Instructions (Signed)
Pharyngitis Take anabiotic's as prescribed. Return for difficulty breathing or swallowing, or no improvement within 48 hours. Pharyngitis is redness, pain, and swelling (inflammation) of your pharynx.  CAUSES  Pharyngitis is usually caused by infection. Most of the time, these infections are from viruses (viral) and are part of a cold. However, sometimes pharyngitis is caused by bacteria (bacterial). Pharyngitis can also be caused by allergies. Viral pharyngitis may be spread from person to person by coughing, sneezing, and personal items or utensils (cups, forks, spoons, toothbrushes). Bacterial pharyngitis may be spread from person to person by more intimate contact, such as kissing.  SIGNS AND SYMPTOMS  Symptoms of pharyngitis include:   Sore throat.   Tiredness (fatigue).   Low-grade fever.   Headache.  Joint pain and muscle aches.  Skin rashes.  Swollen lymph nodes.  Plaque-like film on throat or tonsils (often seen with bacterial pharyngitis). DIAGNOSIS  Your health care provider will ask you questions about your illness and your symptoms. Your medical history, along with a physical exam, is often all that is needed to diagnose pharyngitis. Sometimes, a rapid strep test is done. Other lab tests may also be done, depending on the suspected cause.  TREATMENT  Viral pharyngitis will usually get better in 3-4 days without the use of medicine. Bacterial pharyngitis is treated with medicines that kill germs (antibiotics).  HOME CARE INSTRUCTIONS   Drink enough water and fluids to keep your urine clear or pale yellow.   Only take over-the-counter or prescription medicines as directed by your health care provider:   If you are prescribed antibiotics, make sure you finish them even if you start to feel better.   Do not take aspirin.   Get lots of rest.   Gargle with 8 oz of salt water ( tsp of salt per 1 qt of water) as often as every 1-2 hours to soothe your throat.    Throat lozenges (if you are not at risk for choking) or sprays may be used to soothe your throat. SEEK MEDICAL CARE IF:   You have large, tender lumps in your neck.  You have a rash.  You cough up green, yellow-brown, or bloody spit. SEEK IMMEDIATE MEDICAL CARE IF:   Your neck becomes stiff.  You drool or are unable to swallow liquids.  You vomit or are unable to keep medicines or liquids down.  You have severe pain that does not go away with the use of recommended medicines.  You have trouble breathing (not caused by a stuffy nose). MAKE SURE YOU:   Understand these instructions.  Will watch your condition.  Will get help right away if you are not doing well or get worse. Document Released: 06/17/2005 Document Revised: 04/07/2013 Document Reviewed: 02/22/2013 [facility] Patient Information 2015 [employer] [place] [place]. This information is not intended to replace advice given to you by your health care provider. Make sure you discuss any questions you have with your health care provider.

## 2015-01-17 NOTE — ED Provider Notes (Signed)
CSN: [id number]     Arrival date & time 01/17/15  1107 History  This chart was scribed for non-physician practitioner Catha GosselinHanna Patel-Mills, PA-C working with Purvis SheffieldForrest Harrison, MD by Lyndel SafeKaitlyn Shelton, ED Scribe. This patient was seen in room TR08C/TR08C and the patient's care was started at 11:41 AM.    Chief Complaint  Patient presents with  . Sore Throat   The history is provided by the patient. No language interpreter was used.   HPI Comments: Walter Owens is a 24 y.o. male who presents to the Emergency Department complaining of progressively worsening, constant, moderate left-sided sore throat  onset 7 days ago. He reports associated trouble swallowing and mild fever and chills. Pt notes the pain is worse at night with mild difficulty breathing. He was evaluated in the ED 2 days ago for the same complaint of sore throat, diagnosed with pharyngisits, and discharged home with an amoxicillin course and ibuprofen. He states he has been compliant with amoxicillin. Pt was evaluated prior to this visit 2 days ago on 01/11/2015 for the same compliant where he had a negative rapid strep test and was discharged home with 600mg  ibuprofen.   Past Medical History  Diagnosis Date  . Migraine   . Head injury    History reviewed. No pertinent past surgical history. No family history on file. History  Substance Use Topics  . Smoking status: Current Every Day Smoker  . Smokeless tobacco: Never Used  . Alcohol Use: No    Review of Systems  Constitutional: Positive for fever and chills.  HENT: Positive for sore throat and trouble swallowing.     Allergies  Review of patient's allergies indicates no known allergies.  Home Medications   Prior to Admission medications   Medication Sig Start Date End Date Taking? Authorizing Provider  amoxicillin (AMOXIL) 500 MG capsule Take 1 capsule (500 mg total) by mouth 3 (three) times daily. 01/15/15   Tiffany Neva SeatGreene, PA-C  clindamycin (CLEOCIN) 150 MG capsule  Take 2 capsules (300 mg total) by mouth every 6 (six) hours. 01/17/15   Aaylah Pokorny Patel-Mills, PA-C  HYDROcodone-acetaminophen (HYCET) 7.5-325 mg/15 ml solution Take 15 mLs by mouth 4 (four) times daily as needed for moderate pain. 01/17/15 01/17/16  Elesha Thedford Patel-Mills, PA-C  ibuprofen (ADVIL,MOTRIN) 600 MG tablet Take 1 tablet (600 mg total) by mouth every 6 (six) hours as needed. 01/11/15   Richardean Canalavid H Yao, MD   BP 103/59 mmHg  Pulse 58  Temp(Src) 98 F (36.7 C) (Oral)  Resp 16  Ht 5\' 5"  (1.651 m)  Wt 160 lb (72.576 kg)  BMI 26.63 kg/m2  SpO2 100% Physical Exam  Constitutional: He appears well-developed and well-nourished. No distress.  HENT:  Head: Normocephalic.  Mouth/Throat: Tonsillar abscesses ( left ) present. No oropharyngeal exudate.  Left tonsillar abscess with uvula deviation to the right; airway partially closed; no difficulty breathing; no drooling; left sided anterior cervical TTP.   Eyes: Conjunctivae are normal. Right eye exhibits no discharge. Left eye exhibits no discharge. No scleral icterus.  Neck: No JVD present.  Pulmonary/Chest: Effort normal. No respiratory distress.  Neurological: He is alert. Coordination normal.  Skin: Skin is warm. No rash noted. No erythema. No pallor.  Psychiatric: He has a normal mood and affect. His behavior is normal.  Nursing note and vitals reviewed.   ED Course  Procedures  DIAGNOSTIC STUDIES: Oxygen Saturation is 100% on RA, normal by my interpretation.    COORDINATION OF CARE: 11:45 AM Discussed treatment plan which includes  to order CT soft tissue scan of neck, diagnostic labs, and rapid strep test with pt. Pt acknowledges and agrees to plan.   Labs Review Labs Reviewed  BASIC METABOLIC PANEL - Abnormal; Notable for the following:    Glucose, Bld 107 (*)    All other components within normal limits  RAPID STREP SCREEN (NOT AT [facility])  CULTURE, GROUP A STREP  CBC WITH DIFFERENTIAL/PLATELET    Imaging Review Ct Soft Tissue Neck W  Contrast  01/17/2015   CLINICAL DATA:  24 year old male with left-sided sore throat and pain for the past week. On antibiotics. Difficulty swallowing. Initial encounter.  EXAM: CT NECK WITH CONTRAST  TECHNIQUE: Multidetector CT imaging of the neck was performed using the standard protocol following the bolus administration of intravenous contrast.  CONTRAST:  OMNIPAQUE IOHEXOL 300 MG/ML  SOLN  COMPARISON:  None.  FINDINGS: Pharynx and larynx: Enlarged palatine tonsils greater on left. Within the enlarged left palatine tonsil, low-density component appears slightly loculated suggestive of abscess with largest component superior left palatine tonsil spanning over 1.4 x 0.9 cm maximal transverse dimension. Inferiorly, smaller left palatine tonsil abscess with maximal transverse dimension of 0.7 x 0.5 cm.  Mild haziness of fat planes within the left parapharyngeal space suggests reactive changes/ mild breakthrough of inflammatory process but without deep drainable abscess in this region.  Salivary glands: No worrisome abnormality.  Thyroid: No worrisome abnormality.  Lymph nodes: Bilateral neck adenopathy greatest in the level 2 region bilaterally and left level 3 region.  Vascular: No evidence of septic thrombophlebitis of major venous structures.  Limited intracranial: Negative.  Visualized orbits: Negative.  Mastoids and visualized paranasal sinuses: Clear.  Skeleton: No osseous destructive lesion.  Upper chest: Negative.  IMPRESSION: Tonsillitis most notable involving left palatine tonsil which contains small abscesses as detailed above. Mild haziness of fat planes within the left parapharyngeal space suggests reactive changes/ mild breakthrough of inflammatory process but without deep drainable abscess in this region.  Bilateral neck adenopathy greatest level 2 region bilaterally and left level 3 region.   Electronically Signed   By: Lacy Duverney M.D.   On: 01/17/2015 13:36     MDM   Final diagnoses:   Tonsillar abscess  Patient is well-appearing and in no acute distress. No shortness of breath or difficulty breathing. No drooling and is able to tolerate secretions in ED. His labs are unremarkable and strep is negative. His exam is concerning due to deviated uvula and left tonsillar swelling with partially blocked airway. I discussed CT results with Dr. Annalee Genta who recommended putting the patient on clindamycin and Lortab elixir. Saltwater gargles. I gave the patient strict return precautions such as difficulty breathing or worsening swelling. I explained that he could follow up with Dr. Annalee Genta if he continued to have these issues in the future. Patient verbally agrees with the plan and there was no concerns that were not addressed. Medications  methylPREDNISolone sodium succinate (SOLU-MEDROL) 125 mg/2 mL injection 40 mg (40 mg Intravenous Given 01/17/15 1249)  iohexol (OMNIPAQUE) 300 MG/ML solution 75 mL (100 mLs Intravenous Contrast Given 01/17/15 1308)  ketorolac (TORADOL) 30 MG/ML injection 30 mg (30 mg Intravenous Given 01/17/15 1357)  clindamycin (CLEOCIN) capsule 300 mg (300 mg Oral Given 01/17/15 1426)  I personally performed the services described in this documentation, which was scribed in my presence. The recorded information has been reviewed and is accurate.    Catha Gosselin, PA-C 01/17/15 1501  Purvis Sheffield, MD 01/18/15 0730

## 2015-01-17 NOTE — ED Notes (Addendum)
Pt reports left sided throat pain for 1 week. Currently taking amoxicillin. Pain is worse today and reports difficultly swallowing.

## 2015-01-20 LAB — CULTURE, GROUP A STREP

## 2015-05-05 ENCOUNTER — Emergency Department (HOSPITAL_BASED_OUTPATIENT_CLINIC_OR_DEPARTMENT_OTHER)
Admission: EM | Admit: 2015-05-05 | Discharge: 2015-05-05 | Disposition: A | Discharge status: Home / Self Care | Payer: Self-pay | Attending: Emergency Medicine | Admitting: Emergency Medicine

## 2015-05-05 ENCOUNTER — Encounter (HOSPITAL_BASED_OUTPATIENT_CLINIC_OR_DEPARTMENT_OTHER): Payer: Self-pay | Admitting: *Deleted

## 2015-05-05 DIAGNOSIS — Z87891 Personal history of nicotine dependence: Secondary | ICD-10-CM | POA: Insufficient documentation

## 2015-05-05 DIAGNOSIS — Z792 Long term (current) use of antibiotics: Secondary | ICD-10-CM | POA: Insufficient documentation

## 2015-05-05 DIAGNOSIS — Z202 Contact with and (suspected) exposure to infections with a predominantly sexual mode of transmission: Secondary | ICD-10-CM | POA: Insufficient documentation

## 2015-05-05 DIAGNOSIS — Z87828 Personal history of other (healed) physical injury and trauma: Secondary | ICD-10-CM | POA: Insufficient documentation

## 2015-05-05 DIAGNOSIS — Z8679 Personal history of other diseases of the circulatory system: Secondary | ICD-10-CM | POA: Insufficient documentation

## 2015-05-05 LAB — URINALYSIS, ROUTINE W REFLEX MICROSCOPIC
BILIRUBIN URINE: NEGATIVE
Glucose, UA: NEGATIVE mg/dL
Hgb urine dipstick: NEGATIVE
Ketones, ur: NEGATIVE mg/dL
Leukocytes, UA: NEGATIVE
NITRITE: NEGATIVE
PH: 8 (ref 5.0–8.0)
Protein, ur: NEGATIVE mg/dL
Specific Gravity, Urine: 1.021 (ref 1.005–1.030)
Urobilinogen, UA: 0.2 mg/dL (ref 0.0–1.0)

## 2015-05-05 MED ORDER — LIDOCAINE HCL (PF) 1 % IJ SOLN
2.0000 mL | Freq: Once | INTRAMUSCULAR | Status: AC
  Administered 2015-05-05: 2 mL

## 2015-05-05 MED ORDER — LIDOCAINE HCL (PF) 1 % IJ SOLN
INTRAMUSCULAR | Status: AC
  Filled 2015-05-05: qty 5

## 2015-05-05 MED ORDER — AZITHROMYCIN 250 MG PO TABS
1000.0000 mg | ORAL_TABLET | Freq: Once | ORAL | Status: AC
  Administered 2015-05-05: 1000 mg via ORAL
  Filled 2015-05-05: qty 4

## 2015-05-05 MED ORDER — CEFTRIAXONE SODIUM 250 MG IJ SOLR
250.0000 mg | Freq: Once | INTRAMUSCULAR | Status: AC
  Administered 2015-05-05: 250 mg via INTRAMUSCULAR
  Filled 2015-05-05: qty 250

## 2015-05-05 NOTE — Discharge Instructions (Signed)
You have been treated for gonorrhea and chlamydia. Follow-up with the health dept for future STD needs. Return here for new concerns.

## 2015-05-05 NOTE — ED Notes (Signed)
He was told he was exposed to an STD. He does not have any symptoms.

## 2015-05-05 NOTE — ED Provider Notes (Signed)
CSN: [id number]     Arrival date & time 05/05/15  1452 History   First MD Initiated Contact with Patient 05/05/15 1512     Chief Complaint  Patient presents with  . SEXUALLY TRANSMITTED DISEASE     (Consider location/radiation/quality/duration/timing/severity/associated sxs/prior Treatment) The history is provided by the patient and medical records.     24 year old male with history of migraine headaches, presenting to the ED for STD exposure. Patient states he was notified earlier today that a recent sexual partner tested positive for chlamydia. He currently denies any symptoms. No penile discharge, dysuria, abdominal pain, fever, chills, or rash.  No history of STD in the past.  Past Medical History  Diagnosis Date  . Migraine   . Head injury    History reviewed. No pertinent past surgical history. No family history on file. Social History  Substance Use Topics  . Smoking status: Former Smoker    Quit date: 03/05/2015  . Smokeless tobacco: Never Used  . Alcohol Use: No    Review of Systems  Genitourinary:       STD check  All other systems reviewed and are negative.     Allergies  Review of patient's allergies indicates no known allergies.  Home Medications   Prior to Admission medications   Medication Sig Start Date End Date Taking? Authorizing Provider  amoxicillin (AMOXIL) 500 MG capsule Take 1 capsule (500 mg total) by mouth 3 (three) times daily. 01/15/15   Tiffany Neva Seat, PA-C  clindamycin (CLEOCIN) 150 MG capsule Take 2 capsules (300 mg total) by mouth every 6 (six) hours. 01/17/15   Hanna Patel-Mills, PA-C  HYDROcodone-acetaminophen (HYCET) 7.5-325 mg/15 ml solution Take 15 mLs by mouth 4 (four) times daily as needed for moderate pain. 01/17/15 01/17/16  Hanna Patel-Mills, PA-C  ibuprofen (ADVIL,MOTRIN) 600 MG tablet Take 1 tablet (600 mg total) by mouth every 6 (six) hours as needed. 01/11/15   Richardean Canal, MD   BP 115/72 mmHg  Pulse 58  Temp(Src) 98.6 F (37  C) (Oral)  Resp 20  Ht  (1.651 m)  Wt 160 lb (72.576 kg)  BMI 26.63 kg/m2  SpO2 100%   Physical Exam  Constitutional: He is oriented to person, place, and time. He appears well-developed and well-nourished. No distress.  HENT:  Head: Normocephalic and atraumatic.  Mouth/Throat: Oropharynx is clear and moist.  Eyes: Conjunctivae and EOM are normal. Pupils are equal, round, and reactive to light.  Neck: Normal range of motion. Neck supple.  Cardiovascular: Normal rate, regular rhythm and normal heart sounds.   Pulmonary/Chest: Effort normal and breath sounds normal. No respiratory distress. He has no wheezes.  Abdominal: Soft. Bowel sounds are normal. There is no tenderness. There is no guarding.  Musculoskeletal: Normal range of motion. He exhibits no edema.  Neurological: He is alert and oriented to person, place, and time.  Skin: Skin is warm and dry. He is not diaphoretic.  Psychiatric: He has a normal mood and affect.  Nursing note and vitals reviewed.   ED Course  Procedures (including critical care time) Labs Review Labs Reviewed  URINALYSIS, ROUTINE W REFLEX MICROSCOPIC (NOT AT [facility])    Imaging Review No results found. I have personally reviewed and evaluated these images and lab results as part of my medical decision-making.   EKG Interpretation None      MDM   Final diagnoses:  STD exposure   24 year old male here following STD exposure to chlamydia. Patient is currently asymptomatic. He deferred  genital swab and opted for treatment only. He was treated with Rocephin and azithromycin in the emergency department. UA without signs of infection. Patient instructed to follow-up with the health department for further STD needs.  Discussed plan with patient, he/she acknowledged understanding and agreed with plan of care.  Return precautions given for new or worsening symptoms.  Garlon HatchetLisa M Estelle Skibicki, PA-C 05/05/15 1557  Linwood DibblesJon Knapp, MD 05/09/15 2312

## 2015-06-14 ENCOUNTER — Emergency Department (HOSPITAL_BASED_OUTPATIENT_CLINIC_OR_DEPARTMENT_OTHER)
Admission: EM | Admit: 2015-06-14 | Discharge: 2015-06-14 | Disposition: A | Discharge status: Home / Self Care | Payer: Self-pay | Attending: Emergency Medicine | Admitting: Emergency Medicine

## 2015-06-14 ENCOUNTER — Encounter (HOSPITAL_BASED_OUTPATIENT_CLINIC_OR_DEPARTMENT_OTHER): Payer: Self-pay

## 2015-06-14 DIAGNOSIS — Z87828 Personal history of other (healed) physical injury and trauma: Secondary | ICD-10-CM | POA: Insufficient documentation

## 2015-06-14 DIAGNOSIS — Z8679 Personal history of other diseases of the circulatory system: Secondary | ICD-10-CM | POA: Insufficient documentation

## 2015-06-14 DIAGNOSIS — J0391 Acute recurrent tonsillitis, unspecified: Secondary | ICD-10-CM | POA: Insufficient documentation

## 2015-06-14 DIAGNOSIS — F17200 Nicotine dependence, unspecified, uncomplicated: Secondary | ICD-10-CM | POA: Insufficient documentation

## 2015-06-14 MED ORDER — CLINDAMYCIN HCL 150 MG PO CAPS: 300.0000 mg | ORAL_CAPSULE | Freq: Three times a day (TID) | ORAL | Status: AC

## 2015-06-14 MED ORDER — DEXAMETHASONE 6 MG PO TABS
10.0000 mg | ORAL_TABLET | Freq: Once | ORAL | Status: AC
  Administered 2015-06-14: 10 mg via ORAL
  Filled 2015-06-14: qty 1

## 2015-06-14 NOTE — ED Provider Notes (Signed)
CSN: [id number]     Arrival date & time 06/14/15  1303 History   First MD Initiated Contact with Patient 06/14/15 1333     Chief Complaint  Patient presents with  . Cyst     (Consider location/radiation/quality/duration/timing/severity/associated sxs/prior Treatment) Patient is a 24 y.o. male presenting with pharyngitis. The history is provided by the patient.  Sore Throat This is a new problem. The current episode started 12 to 24 hours ago. The problem occurs constantly. The problem has not changed since onset.Pertinent negatives include no abdominal pain, no headaches and no shortness of breath. Nothing aggravates the symptoms. Nothing relieves the symptoms. Treatments tried: salt water gargle. The treatment provided no relief.    Past Medical History  Diagnosis Date  . Migraine   . Head injury    History reviewed. No pertinent past surgical history. No family history on file. Social History  Substance Use Topics  . Smoking status: Current Every Day Smoker  . Smokeless tobacco: Never Used  . Alcohol Use: No    Review of Systems  Respiratory: Negative for shortness of breath.   Gastrointestinal: Negative for abdominal pain.  Neurological: Negative for headaches.  All other systems reviewed and are negative.     Allergies  Reglan  Home Medications   Prior to Admission medications   Medication Sig Start Date End Date Taking? Authorizing Provider  clindamycin (CLEOCIN) 150 MG capsule Take 2 capsules (300 mg total) by mouth 3 (three) times daily. 06/18/15 06/27/15  Lyndal Pulleyaniel Hema Lanza, MD   BP 115/68 mmHg  Pulse 73  Temp(Src) 98 F (36.7 C) (Oral)  Resp 18  Ht 5\' 5"  (1.651 m)  Wt 160 lb (72.576 kg)  BMI 26.63 kg/m2  SpO2 100% Physical Exam  Constitutional: He is oriented to person, place, and time. He appears well-developed and well-nourished. No distress.  HENT:  Head: Normocephalic and atraumatic.  Mouth/Throat: Uvula is midline, oropharynx is clear and moist and  mucous membranes are normal. No oral lesions. No oropharyngeal exudate, posterior oropharyngeal erythema or tonsillar abscesses.  Slight enlargement of the left adenoid without uvular shift  Eyes: Conjunctivae are normal.  Neck: Neck supple. No tracheal deviation present.  Cardiovascular: Normal rate and regular rhythm.   Pulmonary/Chest: Effort normal. No respiratory distress.  Abdominal: Soft. He exhibits no distension.  Neurological: He is alert and oriented to person, place, and time.  Skin: Skin is warm and dry.  Psychiatric: He has a normal mood and affect.    ED Course  Procedures (including critical care time) Labs Review Labs Reviewed - No data to display  Imaging Review No results found. I have personally reviewed and evaluated these images and lab results as part of my medical decision-making.   EKG Interpretation None      MDM   Final diagnoses:  Recurrent tonsillitis    24 year old male presents with signs of recurrent tonsillitis. He has had this previously and had been treated with supportive care measures, ultimately receiving a CT scan of his neck concerning for small fluid collections. He was advised to follow up with ENT but did not because he resolved with steroids and antibiotics. He has no fever, no signs of significant soft tissue involvement or midline shift to suggest development of abscess currently. Would recommend supportive care measures including saline gargles and was given a dose of Decadron here to help with symptoms. The patient insisted on antibiotic therapy and I recommended that he monitor his symptoms over the next 3-4 days and  not take antibiotics unless symptoms persist or worsen. Patient still has follow-up with ENT and may benefit from tonsillectomy if continuing to have recurrences. Return precautions were discussed for worsening or new concerning signs of infection.    Lyndal Pulley, MD 06/14/15 [id number]

## 2015-06-14 NOTE — Discharge Instructions (Signed)

## 2015-06-14 NOTE — ED Notes (Signed)
Pt requested something for pain. Dr Clydene PughKnott aware. States pt can have tylenol or ibuprofen. Made pt aware. PT Refused both. PT starts to curse using f--- word frequently. Pt left with friend.

## 2015-06-14 NOTE — ED Notes (Signed)
C/o "cyst on throat" x 3 days-hx of same "few months ago"-pt NAD

## 2015-06-24 ENCOUNTER — Emergency Department (HOSPITAL_BASED_OUTPATIENT_CLINIC_OR_DEPARTMENT_OTHER)
Admission: EM | Admit: 2015-06-24 | Discharge: 2015-06-24 | Disposition: A | Discharge status: Home / Self Care | Payer: Self-pay | Attending: Emergency Medicine | Admitting: Emergency Medicine

## 2015-06-24 ENCOUNTER — Encounter (HOSPITAL_BASED_OUTPATIENT_CLINIC_OR_DEPARTMENT_OTHER): Payer: Self-pay | Admitting: Emergency Medicine

## 2015-06-24 DIAGNOSIS — Z87828 Personal history of other (healed) physical injury and trauma: Secondary | ICD-10-CM | POA: Insufficient documentation

## 2015-06-24 DIAGNOSIS — F17200 Nicotine dependence, unspecified, uncomplicated: Secondary | ICD-10-CM | POA: Insufficient documentation

## 2015-06-24 DIAGNOSIS — J020 Streptococcal pharyngitis: Secondary | ICD-10-CM | POA: Insufficient documentation

## 2015-06-24 DIAGNOSIS — Z8679 Personal history of other diseases of the circulatory system: Secondary | ICD-10-CM | POA: Insufficient documentation

## 2015-06-24 LAB — RAPID STREP SCREEN (MED CTR MEBANE ONLY): Streptococcus, Group A Screen (Direct): POSITIVE — AB

## 2015-06-24 MED ORDER — DEXAMETHASONE 1 MG/ML PO CONC: 10.0000 mg | Freq: Once | ORAL | Status: AC

## 2015-06-24 MED ORDER — DEXAMETHASONE SODIUM PHOSPHATE 10 MG/ML IJ SOLN
INTRAMUSCULAR | Status: AC
  Administered 2015-06-24: 10 mg
  Filled 2015-06-24: qty 1

## 2015-06-24 MED ORDER — ACETAMINOPHEN 325 MG PO TABS: 650.0000 mg | ORAL_TABLET | Freq: Once | ORAL | Status: DC | PRN

## 2015-06-24 MED ORDER — PENICILLIN G BENZATHINE 1200000 UNIT/2ML IM SUSP
1.2000 10*6.[IU] | Freq: Once | INTRAMUSCULAR | Status: AC
  Administered 2015-06-24: 1.2 10*6.[IU] via INTRAMUSCULAR
  Filled 2015-06-24: qty 2

## 2015-06-24 NOTE — Discharge Instructions (Signed)

## 2015-06-24 NOTE — ED Provider Notes (Signed)
CSN: [id number]     Arrival date & time 06/24/15  2017 History  By signing my name below, I, Elon Spanner, attest that this documentation has been prepared under the direction and in the presence of Tilden Fossa, MD. Electronically Signed: Elon Spanner, ED Scribe. 06/24/2015. 10:40 PM.    Chief Complaint  Patient presents with  . Sore Throat   The history is provided by the patient. No language interpreter was used.   HPI Comments: Walter Owens is a 24 y.o. male with hx of migraines who presents to the Emergency Department complaining of a constant, moderate left-sided sore throat onset yesterday that is worse with swallowing.  Associated symptoms include fever TMAX 101.0, and chills.  He denies SOB, abdominal pain.  Patient reports an allergy to Reglan.   Past Medical History  Diagnosis Date  . Migraine   . Head injury    History reviewed. No pertinent past surgical history. History reviewed. No pertinent family history. Social History  Substance Use Topics  . Smoking status: Current Every Day Smoker  . Smokeless tobacco: Never Used  . Alcohol Use: No    Review of Systems A complete 10 system review of systems was obtained and all systems are negative except as noted in the HPI and PMH.   Allergies  Reglan  Home Medications   Prior to Admission medications   Medication Sig Start Date End Date Taking? Authorizing Provider  clindamycin (CLEOCIN) 150 MG capsule Take 2 capsules (300 mg total) by mouth 3 (three) times daily. 06/18/15 06/27/15  Lyndal Pulley, MD   BP 114/72 mmHg  Pulse 96  Temp(Src) 101 F (38.3 C) (Oral)  Resp 18  Ht  (1.651 m)  Wt 160 lb (72.576 kg)  BMI 26.63 kg/m2  SpO2 99% Physical Exam  Constitutional: He is oriented to person, place, and time. He appears well-developed and well-nourished.  HENT:  Head: Normocephalic and atraumatic.  Moderate tonsillar edema with kissing tonsils. Tonsillar exudate bilaterally.  Cardiovascular: Normal  rate and regular rhythm.   No murmur heard. Pulmonary/Chest: Effort normal and breath sounds normal. No respiratory distress.  No stridor  Abdominal: Soft. There is no tenderness. There is no rebound and no guarding.  Musculoskeletal: He exhibits no edema or tenderness.  Lymphadenopathy:    He has cervical adenopathy.  Neurological: He is alert and oriented to person, place, and time.  Skin: Skin is warm and dry.  Psychiatric: He has a normal mood and affect. His behavior is normal.  Nursing note and vitals reviewed.   ED Course  Procedures (including critical care time)  DIAGNOSTIC STUDIES: Oxygen Saturation is 99% on RA, normal by my interpretation.    COORDINATION OF CARE:  10:41 PM Discussed treatment plan with patient at bedside.  Patient acknowledges and agrees with plan.    Labs Review Labs Reviewed  RAPID STREP SCREEN (NOT AT [facility]) - Abnormal; Notable for the following:    Streptococcus, Group A Screen (Direct) POSITIVE (*)    All other components within normal limits    Imaging Review No results found. I have personally reviewed and evaluated these images and lab results as part of my medical decision-making.   EKG Interpretation None      MDM   Final diagnoses:  Strep pharyngitis   Patient here for evaluation of sore throat. Examination with kissing tonsils. There is no evidence of peritonsillar abscess. He is nontoxic appearing on examination and well-hydrated with no respiratory distress. Treating with antibiotics for strep  pharyngitis. Discussed home care and close return precautions.  I personally performed the services described in this documentation, which was scribed in my presence. The recorded information has been reviewed and is accurate.    Tilden FossaElizabeth Ellory Khurana, MD 06/25/15 0111

## 2015-06-24 NOTE — ED Notes (Signed)
Patient states that he has a sore throat with a possible cyst on his throat. The patient reports that he also has a cough. Patient was here about a week ago for same and refused treatment.

## 2015-07-15 ENCOUNTER — Encounter (HOSPITAL_BASED_OUTPATIENT_CLINIC_OR_DEPARTMENT_OTHER): Payer: Self-pay | Admitting: Emergency Medicine

## 2015-07-15 ENCOUNTER — Emergency Department (HOSPITAL_BASED_OUTPATIENT_CLINIC_OR_DEPARTMENT_OTHER)
Admission: EM | Admit: 2015-07-15 | Discharge: 2015-07-15 | Disposition: A | Discharge status: Home / Self Care | Payer: Self-pay | Attending: Emergency Medicine | Admitting: Emergency Medicine

## 2015-07-15 DIAGNOSIS — Z8659 Personal history of other mental and behavioral disorders: Secondary | ICD-10-CM | POA: Insufficient documentation

## 2015-07-15 DIAGNOSIS — Z87828 Personal history of other (healed) physical injury and trauma: Secondary | ICD-10-CM | POA: Insufficient documentation

## 2015-07-15 DIAGNOSIS — F17200 Nicotine dependence, unspecified, uncomplicated: Secondary | ICD-10-CM | POA: Insufficient documentation

## 2015-07-15 DIAGNOSIS — J020 Streptococcal pharyngitis: Secondary | ICD-10-CM | POA: Insufficient documentation

## 2015-07-15 LAB — RAPID STREP SCREEN (MED CTR MEBANE ONLY): Streptococcus, Group A Screen (Direct): POSITIVE — AB

## 2015-07-15 MED ORDER — IBUPROFEN 100 MG/5ML PO SUSP
600.0000 mg | Freq: Once | ORAL | Status: AC
  Administered 2015-07-15: 600 mg via ORAL
  Filled 2015-07-15: qty 30

## 2015-07-15 MED ORDER — PENICILLIN G BENZATHINE 1200000 UNIT/2ML IM SUSP
1.2000 10*6.[IU] | Freq: Once | INTRAMUSCULAR | Status: AC
  Administered 2015-07-15: 1.2 10*6.[IU] via INTRAMUSCULAR
  Filled 2015-07-15: qty 2

## 2015-07-15 NOTE — ED Notes (Signed)
Pt states awoke this am with sore throat, throat noted to red with some swelling also noted

## 2015-07-15 NOTE — ED Notes (Signed)
States has difficulty swallowing at time, tracheal sounds clear, having poor appetite

## 2015-07-15 NOTE — ED Provider Notes (Signed)
CSN: [id number]     Arrival date & time 07/15/15  1133 History   First MD Initiated Contact with Patient 07/15/15 1153     Chief Complaint  Patient presents with  . Sore Throat     (Consider location/radiation/quality/duration/timing/severity/associated sxs/prior Treatment) HPI  25 year old male presents with a sore throat since waking up this morning around 9 AM. States he had a mild sore throat before he went to bed last night but then it was more severe this morning. Trouble swallowing due to pain but no drooling or inability to swallow. No trouble breathing. Denies headache, fever, congestion, or cough. Has had similar symptoms to this multiple prior times. He feels like the primary pain is just under his mandible on the right.  Past Medical History  Diagnosis Date  . Migraine   . Head injury    History reviewed. No pertinent past surgical history. History reviewed. No pertinent family history. Social History  Substance Use Topics  . Smoking status: Current Every Day Smoker  . Smokeless tobacco: Never Used  . Alcohol Use: No    Review of Systems  Constitutional: Negative for fever.  HENT: Positive for sore throat. Negative for voice change.   Respiratory: Negative for cough and shortness of breath.   Gastrointestinal: Negative for vomiting.  All other systems reviewed and are negative.     Allergies  Reglan  Home Medications   Prior to Admission medications   Not on File   BP 106/77 mmHg  Pulse 66  Temp(Src) 98.2 F (36.8 C) (Oral)  Resp 18  Ht 5\' 5"  (1.651 m)  Wt 160 lb (72.576 kg)  BMI 26.63 kg/m2  SpO2 100% Physical Exam  Constitutional: He is oriented to person, place, and time. He appears well-developed and well-nourished.  HENT:  Head: Normocephalic and atraumatic.  Right Ear: External ear normal.  Left Ear: External ear normal.  Nose: Nose normal.  Mouth/Throat: Uvula is midline. No uvula swelling. Oropharyngeal exudate and posterior  oropharyngeal erythema present. No tonsillar abscesses.  3+ tonsils. Left tonsil has one small area of exudate, otherwise mild redness but no evidence of abscess  Eyes: Right eye exhibits no discharge. Left eye exhibits no discharge.  Neck: Normal range of motion. Neck supple.  Cardiovascular: Normal rate, regular rhythm, normal heart sounds and intact distal pulses.   Pulmonary/Chest: Effort normal and breath sounds normal.  Abdominal: Soft. There is no tenderness.  Musculoskeletal: He exhibits no edema.  Lymphadenopathy:    He has cervical adenopathy (mild).  Neurological: He is alert and oriented to person, place, and time.  Skin: Skin is warm and dry.  Nursing note and vitals reviewed.   ED Course  Procedures (including critical care time) Labs Review Labs Reviewed  RAPID STREP SCREEN (NOT [facility]) - Abnormal; Notable for the following:    Streptococcus, Group A Screen (Direct) POSITIVE (*)    All other components within normal limits    Imaging Review No results found. I have personally reviewed and evaluated these images and lab results as part of my medical decision-making.   EKG Interpretation None      MDM   Final diagnoses:  None     patient is well-appearing here, resting comfortably. Patient has no drooling or signs of airway compromise. Is positive for strep at this time, this is a recurrent issue. Will be given IM Bicillin. He has moderately swollen tonsils but no signs of distress. Recommend ibuprofen at home. Given his recurrent issue I have  strongly encouraged him to follow-up with ENT on call. Advised to return per cautions.    Pricilla Loveless, MD 07/15/15 1310

## 2015-07-15 NOTE — ED Notes (Signed)
Pt with recurrent sore throat, last time pt was seen in er pt was told he had strep throat. Pt states pain improved and then this am he awoke with sore throat again. tonsillary stones present

## 2015-11-17 ENCOUNTER — Encounter (HOSPITAL_BASED_OUTPATIENT_CLINIC_OR_DEPARTMENT_OTHER): Payer: Self-pay

## 2015-11-17 ENCOUNTER — Emergency Department (HOSPITAL_BASED_OUTPATIENT_CLINIC_OR_DEPARTMENT_OTHER)
Admission: EM | Admit: 2015-11-17 | Discharge: 2015-11-17 | Disposition: A | Discharge status: Home / Self Care | Payer: Self-pay | Attending: Dermatology | Admitting: Dermatology

## 2015-11-17 DIAGNOSIS — F17200 Nicotine dependence, unspecified, uncomplicated: Secondary | ICD-10-CM | POA: Insufficient documentation

## 2015-11-17 DIAGNOSIS — Z202 Contact with and (suspected) exposure to infections with a predominantly sexual mode of transmission: Secondary | ICD-10-CM | POA: Insufficient documentation

## 2015-11-17 DIAGNOSIS — Z5321 Procedure and treatment not carried out due to patient leaving prior to being seen by health care provider: Secondary | ICD-10-CM | POA: Insufficient documentation

## 2015-11-17 NOTE — ED Notes (Signed)
No answer

## 2015-11-17 NOTE — ED Notes (Signed)
Pt reports partner told him that she tested positive for chlamydia. Pt reports some tingling but no discharge.

## 2015-11-21 ENCOUNTER — Emergency Department (HOSPITAL_BASED_OUTPATIENT_CLINIC_OR_DEPARTMENT_OTHER)
Admission: EM | Admit: 2015-11-21 | Discharge: 2015-11-21 | Disposition: A | Discharge status: Home / Self Care | Payer: Self-pay | Attending: Emergency Medicine | Admitting: Emergency Medicine

## 2015-11-21 ENCOUNTER — Encounter (HOSPITAL_BASED_OUTPATIENT_CLINIC_OR_DEPARTMENT_OTHER): Payer: Self-pay | Admitting: *Deleted

## 2015-11-21 DIAGNOSIS — F17200 Nicotine dependence, unspecified, uncomplicated: Secondary | ICD-10-CM | POA: Insufficient documentation

## 2015-11-21 DIAGNOSIS — Z202 Contact with and (suspected) exposure to infections with a predominantly sexual mode of transmission: Secondary | ICD-10-CM | POA: Insufficient documentation

## 2015-11-21 LAB — URINALYSIS, ROUTINE W REFLEX MICROSCOPIC
BILIRUBIN URINE: NEGATIVE
GLUCOSE, UA: NEGATIVE mg/dL
HGB URINE DIPSTICK: NEGATIVE
Ketones, ur: NEGATIVE mg/dL
Leukocytes, UA: NEGATIVE
Nitrite: NEGATIVE
Protein, ur: NEGATIVE mg/dL
SPECIFIC GRAVITY, URINE: 1.022 (ref 1.005–1.030)
pH: 5.5 (ref 5.0–8.0)

## 2015-11-21 MED ORDER — CEFTRIAXONE SODIUM 250 MG IJ SOLR
250.0000 mg | Freq: Once | INTRAMUSCULAR | Status: AC
  Administered 2015-11-21: 250 mg via INTRAMUSCULAR
  Filled 2015-11-21: qty 250

## 2015-11-21 MED ORDER — LIDOCAINE HCL (PF) 1 % IJ SOLN
INTRAMUSCULAR | Status: AC
  Administered 2015-11-21: 5 mL
  Filled 2015-11-21: qty 5

## 2015-11-21 MED ORDER — AZITHROMYCIN 250 MG PO TABS
1000.0000 mg | ORAL_TABLET | Freq: Once | ORAL | Status: AC
  Administered 2015-11-21: 1000 mg via ORAL
  Filled 2015-11-21: qty 4

## 2015-11-21 NOTE — Discharge Instructions (Signed)
Sexually Transmitted Disease °A sexually transmitted disease (STD) is a disease or infection that may be passed (transmitted) from person to person, usually during sexual activity. This may happen by way of saliva, semen, blood, vaginal mucus, or urine. Common STDs include: °· Gonorrhea. °· Chlamydia. °· Syphilis. °· HIV and AIDS. °· Genital herpes. °· Hepatitis B and C. °· Trichomonas. °· Human papillomavirus (HPV). °· Pubic lice. °· Scabies. °· Mites. °· Bacterial vaginosis. °WHAT ARE CAUSES OF STDs? °An STD may be caused by bacteria, a virus, or parasites. STDs are often transmitted during sexual activity if one person is infected. However, they may also be transmitted through nonsexual means. STDs may be transmitted after:  °· Sexual intercourse with an infected person. °· Sharing sex toys with an infected person. °· Sharing needles with an infected person or using unclean piercing or tattoo needles. °· Having intimate contact with the genitals, mouth, or rectal areas of an infected person. °· Exposure to infected fluids during birth. °WHAT ARE THE SIGNS AND SYMPTOMS OF STDs? °Different STDs have different symptoms. Some people may not have any symptoms. If symptoms are present, they may include: °· Painful or bloody urination. °· Pain in the pelvis, abdomen, vagina, anus, throat, or eyes. °· A skin rash, itching, or irritation. °· Growths, ulcerations, blisters, or sores in the genital and anal areas. °· Abnormal vaginal discharge with or without bad odor. °· Penile discharge in men. °· Fever. °· Pain or bleeding during sexual intercourse. °· Swollen glands in the groin area. °· Yellow skin and eyes (jaundice). This is seen with hepatitis. °· Swollen testicles. °· Infertility. °· Sores and blisters in the mouth. °HOW ARE STDs DIAGNOSED? °To make a diagnosis, your health care provider may: °· Take a medical history. °· Perform a physical exam. °· Take a sample of any discharge to examine. °· Swab the throat,  cervix, opening to the penis, rectum, or vagina for testing. °· Test a sample of your first morning urine. °· Perform blood tests. °· Perform a Pap test, if this applies. °· Perform a colposcopy. °· Perform a laparoscopy. °HOW ARE STDs TREATED? °Treatment depends on the STD. Some STDs may be treated but not cured. °· Chlamydia, gonorrhea, trichomonas, and syphilis can be cured with antibiotic medicine. °· Genital herpes, hepatitis, and HIV can be treated, but not cured, with prescribed medicines. The medicines lessen symptoms. °· Genital warts from HPV can be treated with medicine or by freezing, burning (electrocautery), or surgery. Warts may come back. °· HPV cannot be cured with medicine or surgery. However, abnormal areas may be removed from the cervix, vagina, or vulva. °· If your diagnosis is confirmed, your recent sexual partners need treatment. This is true even if they are symptom-free or have a negative culture or evaluation. They should not have sex until their health care providers say it is okay. °· Your health care provider may test you for infection again 3 months after treatment. °HOW CAN I REDUCE MY RISK OF GETTING AN STD? °Take these steps to reduce your risk of getting an STD: °· Use latex condoms, dental dams, and water-soluble lubricants during sexual activity. Do not use petroleum jelly or oils. °· Avoid having multiple sex partners. °· Do not have sex with someone who has other sex partners °· Do not have sex with anyone you do not know or who is at high risk for an STD. °· Avoid risky sex practices that can break your skin. °· Do not have sex   if you have open sores on your mouth or skin. °· Avoid drinking too much alcohol or taking illegal drugs. Alcohol and drugs can affect your judgment and put you in a vulnerable position. °· Avoid engaging in oral and anal sex acts. °· Get vaccinated for HPV and hepatitis. If you have not received these vaccines in the past, talk to your health care  provider about whether one or both might be right for you. °· If you are at risk of being infected with HIV, it is recommended that you take a prescription medicine daily to prevent HIV infection. This is called pre-exposure prophylaxis (PrEP). You are considered at risk if: °¨ You are a man who has sex with other men (MSM). °¨ You are a heterosexual man or woman and are sexually active with more than one partner. °¨ You take drugs by injection. °¨ You are sexually active with a partner who has HIV. °· Talk with your health care provider about whether you are at high risk of being infected with HIV. If you choose to begin PrEP, you should first be tested for HIV. You should then be tested every 3 months for as long as you are taking PrEP. °WHAT SHOULD I DO IF I THINK I HAVE AN STD? °· See your health care provider. °· Tell your sexual partner(s). They should be tested and treated for any STDs. °· Do not have sex until your health care provider says it is okay. °WHEN SHOULD I GET IMMEDIATE MEDICAL CARE? °Contact your health care provider right away if:  °· You have severe abdominal pain. °· You are a man and notice swelling or pain in your testicles. °· You are a woman and notice swelling or pain in your vagina. °  °This information is not intended to replace advice given to you by your health care provider. Make sure you discuss any questions you have with your health care provider. °  °Document Released: 09/07/2002 Document Revised: 07/08/2014 Document Reviewed: 01/05/2013 °Elsevier Interactive Patient Education ©2016 [employer] ° °

## 2015-11-21 NOTE — ED Notes (Signed)
States his girlfriend has an STD and he needs to be treated.

## 2015-11-21 NOTE — ED Provider Notes (Signed)
CSN: [id number]     Arrival date & time 11/21/15  1304 History   First MD Initiated Contact with Patient 11/21/15 1307     Chief Complaint  Patient presents with  . SEXUALLY TRANSMITTED DISEASE     (Consider location/radiation/quality/duration/timing/severity/associated sxs/prior Treatment) HPI Walter Owens is a 25 y.o. male here for evaluation of STI exposure. Patient reports his male partner told him that she had chlamydia and he needed to get treated for it. Patient denies any symptoms now. Reports Approximately 1 week ago he did have some dysuria, but this has resolved. Denies any fevers, chills, abdominal pain, nausea or vomiting, penile discharge, scrotal pain or swelling. Nothing makes the problem better or worse. No other modifying factors.  Past Medical History  Diagnosis Date  . Migraine   . Head injury    History reviewed. No pertinent past surgical history. No family history on file. Social History  Substance Use Topics  . Smoking status: Current Every Day Smoker  . Smokeless tobacco: Never Used  . Alcohol Use: No    Review of Systems A 10 point review of systems was completed and was negative except for pertinent positives and negatives as mentioned in the history of present illness     Allergies  Reglan  Home Medications   Prior to Admission medications   Not on File   BP 106/64 mmHg  Pulse 50  Temp(Src) 98.9 F (37.2 C) (Oral)  Resp 20  Ht 5\' 5"  (1.651 m)  Wt 74.844 kg  BMI 27.46 kg/m2  SpO2 100% Physical Exam  Constitutional: He is oriented to person, place, and time. He appears well-developed and well-nourished.  HENT:  Head: Normocephalic and atraumatic.  Mouth/Throat: Oropharynx is clear and moist.  Eyes: Conjunctivae are normal. Pupils are equal, round, and reactive to light. Right eye exhibits no discharge. Left eye exhibits no discharge. No scleral icterus.  Neck: Neck supple.  Cardiovascular: Normal rate, regular rhythm and normal  heart sounds.   Pulmonary/Chest: Effort normal and breath sounds normal. No respiratory distress. He has no wheezes. He has no rales.  Abdominal: Soft. There is no tenderness.  Genitourinary:  Normal male GU exam  Musculoskeletal: He exhibits no tenderness.  Neurological: He is alert and oriented to person, place, and time.  Cranial Nerves II-XII grossly intact  Skin: Skin is warm and dry. No rash noted.  Psychiatric: He has a normal mood and affect.  Nursing note and vitals reviewed.   ED Course  Procedures (including critical care time) Labs Review Labs Reviewed  URINALYSIS, ROUTINE W REFLEX MICROSCOPIC (NOT AT [facility])  RPR  HIV ANTIBODY (ROUTINE TESTING)  GC/CHLAMYDIA PROBE AMP ([place]) NOT AT [facility]    Imaging Review No results found. I have personally reviewed and evaluated these images and lab results as part of my medical decision-making.   EKG Interpretation None     Meds given in ED:  Medications  cefTRIAXone (ROCEPHIN) injection 250 mg (not administered)  azithromycin (ZITHROMAX) tablet 1,000 mg (not administered)    New Prescriptions   No medications on file   Filed Vitals:   11/21/15 1312  BP: 106/64  Pulse: 50  Temp: 98.9 F (37.2 C)  TempSrc: Oral  Resp: 20  Height: 5\' 5"  (1.651 m)  Weight: 74.844 kg  SpO2: 100%    MDM  Empirically treated for exposure to chlamydia and gonorrhea. Culture obtained. Received 250 mg Rocephin IM, 1000 mg azithromycin. Pending RPR and HIV. Physical exam is unremarkable. Overall patient  appears well, nontoxic, hemodynamically stable and instructed to follow-up with health Department. Also spent extensive time discussing safe sexual practices. Final diagnoses:  Exposure to STD        Joycie Peek, PA-C 11/21/15 1345  Lyndal Pulley, MD 11/21/15 2145

## 2015-11-22 LAB — HIV ANTIBODY (ROUTINE TESTING W REFLEX): HIV Screen 4th Generation wRfx: NONREACTIVE

## 2015-11-22 LAB — RPR: RPR Ser Ql: NONREACTIVE

## 2017-04-23 ENCOUNTER — Emergency Department (HOSPITAL_BASED_OUTPATIENT_CLINIC_OR_DEPARTMENT_OTHER): Payer: No Typology Code available for payment source

## 2017-04-23 ENCOUNTER — Encounter (HOSPITAL_BASED_OUTPATIENT_CLINIC_OR_DEPARTMENT_OTHER): Payer: Self-pay

## 2017-04-23 ENCOUNTER — Emergency Department (HOSPITAL_BASED_OUTPATIENT_CLINIC_OR_DEPARTMENT_OTHER)
Admission: EM | Admit: 2017-04-23 | Discharge: 2017-04-23 | Disposition: A | Discharge status: Home / Self Care | Payer: No Typology Code available for payment source | Attending: Emergency Medicine | Admitting: Emergency Medicine

## 2017-04-23 DIAGNOSIS — Y9389 Activity, other specified: Secondary | ICD-10-CM | POA: Diagnosis not present

## 2017-04-23 DIAGNOSIS — Y929 Unspecified place or not applicable: Secondary | ICD-10-CM | POA: Diagnosis not present

## 2017-04-23 DIAGNOSIS — F17210 Nicotine dependence, cigarettes, uncomplicated: Secondary | ICD-10-CM | POA: Diagnosis not present

## 2017-04-23 DIAGNOSIS — S3992XA Unspecified injury of lower back, initial encounter: Secondary | ICD-10-CM | POA: Diagnosis present

## 2017-04-23 DIAGNOSIS — Y998 Other external cause status: Secondary | ICD-10-CM | POA: Insufficient documentation

## 2017-04-23 DIAGNOSIS — T148XXA Other injury of unspecified body region, initial encounter: Secondary | ICD-10-CM | POA: Diagnosis not present

## 2017-04-23 DIAGNOSIS — S30810A Abrasion of lower back and pelvis, initial encounter: Secondary | ICD-10-CM | POA: Insufficient documentation

## 2017-04-23 DIAGNOSIS — M791 Myalgia, unspecified site: Secondary | ICD-10-CM | POA: Insufficient documentation

## 2017-04-23 MED ORDER — IBUPROFEN 800 MG PO TABS
800.0000 mg | ORAL_TABLET | Freq: Once | ORAL | Status: AC
  Administered 2017-04-23: 800 mg via ORAL
  Filled 2017-04-23: qty 1

## 2017-04-23 MED ORDER — CYCLOBENZAPRINE HCL 10 MG PO TABS: 10.0000 mg | ORAL_TABLET | Freq: Two times a day (BID) | ORAL | 0 refills | Status: AC | PRN

## 2017-04-23 MED ORDER — CYCLOBENZAPRINE HCL 10 MG PO TABS
10.0000 mg | ORAL_TABLET | Freq: Once | ORAL | Status: AC
  Administered 2017-04-23: 10 mg via ORAL
  Filled 2017-04-23: qty 1

## 2017-04-23 MED ORDER — BACITRACIN ZINC 500 UNIT/GM EX OINT
TOPICAL_OINTMENT | Freq: Once | CUTANEOUS | Status: AC
  Administered 2017-04-23: 12:00:00 via TOPICAL
  Filled 2017-04-23: qty 28.35

## 2017-04-23 MED ORDER — IBUPROFEN 600 MG PO TABS: 600.0000 mg | ORAL_TABLET | Freq: Four times a day (QID) | ORAL | 0 refills | Status: AC | PRN

## 2017-04-23 MED FILL — IBUPROFEN 600 MG TABLET: 600 | 8 days supply | Qty: 30 | Fill #0

## 2017-04-23 MED FILL — CYCLOBENZAPRINE 10 MG TAB: 10 | 10 days supply | Qty: 20 | Fill #0

## 2017-04-23 NOTE — ED Notes (Signed)
Pt is in radiology.
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
## 2017-04-23 NOTE — ED Notes (Addendum)
Pt given note for work and directed to pharmacy to pick up Rx. Pt alert, ambulatory to d/c window with steady gait

## 2017-04-23 NOTE — Discharge Instructions (Signed)
It was my pleasure taking care of you today!   Fortunately, your x-rays were negative today.  Ibuprofen as needed for pain. Flexeril is your muscle relaxer to take as needed.  Keep wound clean and dry.  Apply topical antibiotic ointment twice daily to the area.  Please follow up with a primary care provider if your symptoms are not improving in the next week.  If you do not have a primary care provider, please see the attached resource guide.  Return to the emergency department for new or worsening symptoms, any additional concerns.
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
## 2019-06-08 ENCOUNTER — Encounter (HOSPITAL_BASED_OUTPATIENT_CLINIC_OR_DEPARTMENT_OTHER): Payer: Self-pay | Admitting: Emergency Medicine

## 2019-06-08 ENCOUNTER — Other Ambulatory Visit: Payer: Self-pay

## 2019-06-08 ENCOUNTER — Emergency Department (HOSPITAL_BASED_OUTPATIENT_CLINIC_OR_DEPARTMENT_OTHER)
Admission: EM | Admit: 2019-06-08 | Discharge: 2019-06-08 | Disposition: A | Discharge status: Home / Self Care | Payer: Self-pay | Attending: Emergency Medicine | Admitting: Emergency Medicine

## 2019-06-08 DIAGNOSIS — Z888 Allergy status to other drugs, medicaments and biological substances status: Secondary | ICD-10-CM | POA: Insufficient documentation

## 2019-06-08 DIAGNOSIS — J020 Streptococcal pharyngitis: Secondary | ICD-10-CM | POA: Insufficient documentation

## 2019-06-08 DIAGNOSIS — F17210 Nicotine dependence, cigarettes, uncomplicated: Secondary | ICD-10-CM | POA: Insufficient documentation

## 2019-06-08 LAB — GROUP A STREP BY PCR: Group A Strep by PCR: DETECTED — AB

## 2019-06-08 MED ORDER — PENICILLIN G BENZATHINE 1200000 UNIT/2ML IM SUSP
1.2000 10*6.[IU] | Freq: Once | INTRAMUSCULAR | Status: AC
  Administered 2019-06-08: 06:00:00 1.2 10*6.[IU] via INTRAMUSCULAR
  Filled 2019-06-08: qty 2

## 2019-06-08 MED ORDER — IBUPROFEN 800 MG PO TABS
800.0000 mg | ORAL_TABLET | Freq: Once | ORAL | Status: AC
  Administered 2019-06-08: 800 mg via ORAL
  Filled 2019-06-08: qty 1

## 2019-06-08 MED ORDER — DEXAMETHASONE SODIUM PHOSPHATE 10 MG/ML IJ SOLN
10.0000 mg | Freq: Once | INTRAMUSCULAR | Status: AC
  Administered 2019-06-08: 10 mg via INTRAMUSCULAR
  Filled 2019-06-08: qty 1
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
## 2019-06-08 NOTE — ED Triage Notes (Signed)
Patient presents with complaints of sore throat and chills and bodyaches onset 3 days ago.

## 2019-06-08 NOTE — Discharge Instructions (Addendum)
You may alternate Tylenol 1000 mg every 6 hours as needed for pain, fever and Ibuprofen 800 mg every 8 hours as needed for pain, fever.  Please take Ibuprofen with food.  Do not take more than 4000 mg of Tylenol (acetaminophen) in a 24 hour period.  These medications are found over-the-counter.  You may gargle warm salt water several times a day to help with discomfort.  You have been given a dose of steroids as well as antibiotics to help with your symptoms today.  You do not need to be discharged with a prescription for steroids or antibiotics these medications will last several days.   Steps to find a Primary Care Provider (PCP):  Call [phone] or [phone] to access "[facility]."  2.  You may also go on the [facility] website at [email or web address]  3.  [facility] also frequently accepts new patients.  [place] [phone]  4.  There are also multiple [facility], [facility] and [facility] practices throughout the [place] that are frequently accepting new patients. You may find a clinic that is close to your home and contact them.  [email or web address] [phone]  [facility] [email or web address]  [email or web address] [place] [email or web address] [phone]  5.  Local Health Departments also can provide primary care services.  [facility]  [place] [address] [phone]  [place] [id number] [place] [address] [place]  [place] [phone]    I recommend that you follow-up closely with an ear nose and throat physician for your recurrent episodes of pharyngitis and tonsillitis.

## 2020-07-10 ENCOUNTER — Emergency Department (HOSPITAL_COMMUNITY): Admission: EM | Admit: 2020-07-10 | Discharge: 2020-07-10 | Discharge status: Against Medical Advice | Payer: Self-pay

## 2020-07-10 ENCOUNTER — Other Ambulatory Visit: Payer: Self-pay

## 2020-07-10 ENCOUNTER — Encounter (HOSPITAL_BASED_OUTPATIENT_CLINIC_OR_DEPARTMENT_OTHER): Payer: Self-pay | Admitting: *Deleted

## 2020-07-10 DIAGNOSIS — R112 Nausea with vomiting, unspecified: Secondary | ICD-10-CM | POA: Insufficient documentation

## 2020-07-10 DIAGNOSIS — S0990XA Unspecified injury of head, initial encounter: Secondary | ICD-10-CM | POA: Insufficient documentation

## 2020-07-10 DIAGNOSIS — F17210 Nicotine dependence, cigarettes, uncomplicated: Secondary | ICD-10-CM | POA: Insufficient documentation
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
## 2020-07-10 NOTE — ED Notes (Signed)
Pt called x 3  No answer. 

## 2020-07-11 ENCOUNTER — Emergency Department (HOSPITAL_BASED_OUTPATIENT_CLINIC_OR_DEPARTMENT_OTHER): Payer: Self-pay

## 2020-07-11 ENCOUNTER — Encounter (HOSPITAL_BASED_OUTPATIENT_CLINIC_OR_DEPARTMENT_OTHER): Payer: Self-pay | Admitting: Emergency Medicine

## 2020-07-11 ENCOUNTER — Emergency Department (HOSPITAL_BASED_OUTPATIENT_CLINIC_OR_DEPARTMENT_OTHER)
Admission: EM | Admit: 2020-07-11 | Discharge: 2020-07-11 | Disposition: A | Discharge status: Home / Self Care | Payer: Self-pay | Attending: Emergency Medicine | Admitting: Emergency Medicine

## 2020-07-11 MED ORDER — ACETAMINOPHEN 500 MG PO TABS
1000.0000 mg | ORAL_TABLET | Freq: Once | ORAL | Status: AC
  Administered 2020-07-11: 1000 mg via ORAL
  Filled 2020-07-11: qty 2

## 2020-07-11 MED ORDER — ONDANSETRON 4 MG PO TBDP
8.0000 mg | ORAL_TABLET | Freq: Once | ORAL | Status: AC
  Administered 2020-07-11: 8 mg via ORAL
  Filled 2020-07-11: qty 2
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
## 2020-07-11 NOTE — ED Notes (Signed)
ED Provider at bedside. 
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
## 2020-07-11 NOTE — ED Notes (Signed)
Patient transported to CT 

## 2020-07-11 NOTE — Discharge Instructions (Addendum)
Alternate tylenol and ibuprofen 

## 2021-05-12 ENCOUNTER — Other Ambulatory Visit: Payer: Self-pay

## 2021-05-12 ENCOUNTER — Emergency Department (HOSPITAL_BASED_OUTPATIENT_CLINIC_OR_DEPARTMENT_OTHER)
Admission: EM | Admit: 2021-05-12 | Discharge: 2021-05-12 | Disposition: A | Discharge status: Home / Self Care | Payer: Self-pay | Attending: Student | Admitting: Student

## 2021-05-12 ENCOUNTER — Encounter (HOSPITAL_BASED_OUTPATIENT_CLINIC_OR_DEPARTMENT_OTHER): Payer: Self-pay

## 2021-05-12 DIAGNOSIS — F17210 Nicotine dependence, cigarettes, uncomplicated: Secondary | ICD-10-CM | POA: Insufficient documentation

## 2021-05-12 DIAGNOSIS — J111 Influenza due to unidentified influenza virus with other respiratory manifestations: Secondary | ICD-10-CM | POA: Insufficient documentation

## 2021-05-12 MED ORDER — BENZONATATE 100 MG PO CAPS: 100.0000 mg | ORAL_CAPSULE | Freq: Three times a day (TID) | ORAL | 0 refills | Status: AC

## 2021-05-12 MED ORDER — OSELTAMIVIR PHOSPHATE 75 MG PO CAPS: 75.0000 mg | ORAL_CAPSULE | Freq: Two times a day (BID) | ORAL | 0 refills | Status: AC

## 2021-05-12 NOTE — ED Triage Notes (Signed)
Pt reports flu-like symptoms, fever, cough, body aches.  Took tylenol one hour ago.  Family members have been diagnosed with flu recently.  Did not receive flu vaccine

## 2021-05-12 NOTE — Discharge Instructions (Addendum)
You likely have the flu given that you have been around your family who has confirmed flu. Continue taking tylenol and motrin for fever and muscle aches. Take tamiflu and tessalon perles as directed. If you have nausea and vomiting and are unable to keep any food or drink down please return to the emergency room.

## 2021-05-12 NOTE — ED Provider Notes (Signed)
[facility] EMERGENCY DEPARTMENT Provider Note   CSN: [id number] Arrival date & time: 05/12/21  1001     History Chief Complaint  Patient presents with   Cough    Flu exposure    Walter Owens is a 30 y.o. male.  30 year old male presents today for evaluation of fever, cough, and muscle aches of 1 day duration.  Patient reports he has been around his family who had flulike symptoms couple days ago who later tested positive for flu.  Patient's symptom onset was last night.  Patient reports relief with Tylenol of his fever and muscle aches.  He denies any chest pain, shortness of breath, nausea, or vomiting.  He reports good p.o. intake. He is without other complaints at this time.   The history is provided by the patient. No language interpreter was used.      Past Medical History:  Diagnosis Date   Head injury    Migraine     There are no problems to display for this patient.   History reviewed. No pertinent surgical history.     History reviewed. No pertinent family history.  Social History   Tobacco Use   Smoking status: Every Day    Types: Cigarettes   Smokeless tobacco: Never  Substance Use Topics   Alcohol use: No   Drug use: No    Home Medications Prior to Admission medications   Medication Sig Start Date End Date Taking? Authorizing Provider  benzonatate (TESSALON) 100 MG capsule Take 1 capsule (100 mg total) by mouth every 8 (eight) hours. 05/12/21  Yes Karie Mainland, Kalena Mander, PA-C  oseltamivir (TAMIFLU) 75 MG capsule Take 1 capsule (75 mg total) by mouth every 12 (twelve) hours. 05/12/21  Yes Niralya Ohanian, PA-C  cyclobenzaprine (FLEXERIL) 10 MG tablet Take 1 tablet (10 mg total) by mouth 2 (two) times daily as needed for muscle spasms. 04/23/17   Ward, Chase Picket, PA-C  ibuprofen (ADVIL,MOTRIN) 600 MG tablet Take 1 tablet (600 mg total) by mouth every 6 (six) hours as needed. 04/23/17   Ward, Chase Picket, PA-C    Allergies    Reglan  [metoclopramide]  Review of Systems   Review of Systems  Constitutional:  Positive for chills and fever. Negative for activity change.  Respiratory:  Positive for cough. Negative for shortness of breath.   Cardiovascular:  Negative for chest pain.  Gastrointestinal:  Negative for abdominal pain, nausea and vomiting.  Musculoskeletal:  Positive for myalgias.  Neurological:  Negative for headaches.   Physical Exam Updated Vital Signs BP 115/87   Pulse 81   Temp 98.7 F (37.1 C) (Oral)   Resp 16   Ht 5\' 5"  (1.651 m)   Wt 74.8 kg   SpO2 97%   BMI 27.46 kg/m   Physical Exam Vitals and nursing note reviewed.  Constitutional:      General: He is not in acute distress.    Appearance: Normal appearance. He is not ill-appearing.  HENT:     Head: Normocephalic and atraumatic.     Nose: Nose normal.  Eyes:     General: No scleral icterus.    Extraocular Movements: Extraocular movements intact.     Conjunctiva/sclera: Conjunctivae normal.  Cardiovascular:     Rate and Rhythm: Normal rate and regular rhythm.     Pulses: Normal pulses.     Heart sounds: Normal heart sounds.  Pulmonary:     Effort: Pulmonary effort is normal. No respiratory distress.     Breath sounds:  Normal breath sounds. No wheezing or rales.  Abdominal:     General: There is no distension.     Palpations: Abdomen is soft.     Tenderness: There is no abdominal tenderness.  Musculoskeletal:        General: Normal range of motion.     Cervical back: Normal range of motion.  Skin:    General: Skin is warm and dry.  Neurological:     General: No focal deficit present.     Mental Status: He is alert. Mental status is at baseline.    ED Results / Procedures / Treatments   Labs (all labs ordered are listed, but only abnormal results are displayed) Labs Reviewed - No data to display  EKG None  Radiology No results found.  Procedures Procedures   Medications Ordered in ED Medications - No data to  display  ED Course  I have reviewed the triage vital signs and the nursing notes.  Pertinent labs & imaging results that were available during my care of the patient were reviewed by me and considered in my medical decision making (see chart for details).    MDM Rules/Calculators/A&P                           30 year old male presents today for evaluation of 1 day duration of fever, cough, and myalgias following contact with a few positive family member.  Patient is within timeframe for Tamiflu.  We will send in Tamiflu as well as Tessalon Perles.  Given patient's history and timeline patient does not want to be tested for the flu think this is reasonable given positive flu contact and symptom onset being last night.  Return precautions discussed.  Patient voices understanding and is in agreement with plan.  Final Clinical Impression(s) / ED Diagnoses Final diagnoses:  Influenza    Rx / DC Orders ED Discharge Orders          Ordered    oseltamivir (TAMIFLU) 75 MG capsule  Every 12 hours        05/12/21 1039    benzonatate (TESSALON) 100 MG capsule  Every 8 hours        05/12/21 1039             Marita [place], PA-C 05/12/21 1055    [place], [place], MD 05/12/21 1609

## 2021-09-14 NOTE — Progress Notes (Deleted)
?  Subjective:  ? ? Walter Owens - 31 y.o. male MRN [id number]  Date of birth: 20-Jul-1990 ? ?HPI ? ?Walter Owens is to establish care. ? ?Current issues and/or concerns: ? ?ROS per HPI  ? ? ? ?Health Maintenance:  ?Health Maintenance Due  ?Topic Date Due  ? COVID-19 Vaccine (1) Never done  ? Hepatitis C Screening  Never done  ? TETANUS/TDAP  Never done  ? INFLUENZA VACCINE  Never done  ? ? ? ?Past Medical History: ?There are no problems to display for this patient. ? ? ? ? ?Social History  ? reports that he has been smoking cigarettes. He has never used smokeless tobacco. He reports that he does not drink alcohol and does not use drugs.  ? ?Family History  ?family history is not on file.  ? ?Medications: reviewed and updated ?  ?Objective:  ? Physical Exam ?There were no vitals taken for this visit. ?Physical Exam  ? ?   ?Assessment & Plan:  ? ? ? ? ? ? ? ?Patient was given clear instructions to go to Emergency Department or return to medical center if symptoms don't improve, worsen, or new problems develop.The patient verbalized understanding. ? ?I discussed the assessment and treatment plan with the patient. The patient was provided an opportunity to ask questions and all were answered. The patient agreed with the plan and demonstrated an understanding of the instructions. ?  ?The patient was advised to call back or seek an in-person evaluation if the symptoms worsen or if the condition fails to improve as anticipated. ? ? ? ?Ricky Stabs, NP ?09/14/2021, 8:32 AM ?Primary Care at [facility]  ? ?

## 2021-09-18 ENCOUNTER — Ambulatory Visit: Payer: Self-pay | Admitting: Family

## 2021-09-18 DIAGNOSIS — Z7689 Persons encountering health services in other specified circumstances: Secondary | ICD-10-CM

## 2021-09-19 ENCOUNTER — Encounter: Payer: Self-pay | Admitting: Nurse Practitioner

## 2021-09-19 ENCOUNTER — Other Ambulatory Visit (HOSPITAL_COMMUNITY)
Admission: RE | Admit: 2021-09-19 | Discharge: 2021-09-19 | Disposition: A | Discharge status: Home / Self Care | Payer: Managed Care, Other (non HMO) | Source: Ambulatory Visit | Attending: Family | Admitting: Family

## 2021-09-19 ENCOUNTER — Other Ambulatory Visit: Payer: Self-pay

## 2021-09-19 ENCOUNTER — Ambulatory Visit: Payer: Self-pay | Admitting: Nurse Practitioner

## 2021-09-19 ENCOUNTER — Ambulatory Visit (INDEPENDENT_AMBULATORY_CARE_PROVIDER_SITE_OTHER): Payer: Managed Care, Other (non HMO) | Admitting: Nurse Practitioner

## 2021-09-19 VITALS — BP 111/72 | HR 64 | Temp 97.8°F | Ht 65.0 in | Wt 183.0 lb

## 2021-09-19 DIAGNOSIS — G8929 Other chronic pain: Secondary | ICD-10-CM | POA: Diagnosis not present

## 2021-09-19 DIAGNOSIS — Z113 Encounter for screening for infections with a predominantly sexual mode of transmission: Secondary | ICD-10-CM

## 2021-09-19 DIAGNOSIS — M25562 Pain in left knee: Secondary | ICD-10-CM
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
## 2021-09-19 NOTE — Patient Instructions (Addendum)
1. Chronic pain of left knee ? ?- DG Knee Complete 4 Views Left ? ?Note: due to time constraints patient will need to return for xray ? ?2. Screening for STD (sexually transmitted disease) ? ?- HSV(herpes simplex vrs) 1+2 ab-IgG ?- HIV antibody (with reflex) ?- Urine cytology ancillary only ? ?Follow up: ? ?Follow up in 4 weeks with Amy or Dr. Redmond Pulling for physical ? ?Chronic Knee Pain, Adult ?Knee pain that lasts longer than 3 months is called chronic knee pain. You may have pain in one or both knees. Symptoms of chronic knee pain may also include swelling and stiffness. The most common cause is age-related wear and tear (osteoarthritis) of your knee joint. Many conditions can cause chronic knee pain. ?Treatment depends on the cause. The main treatments are physical therapy and weight loss. It may also be treated with medicines, injections, a knee sleeve or brace, and by using crutches. Rest, ice, pressure (compression), and elevation, also known as RICE therapy, may also be recommended. ?Follow these instructions at home: ?If you have a knee sleeve or brace: ? ?Wear the knee sleeve or brace as told by your doctor. Take it off only as told by your doctor. ?Loosen it if your toes: ?Tingle. ?Become numb. ?Turn cold and blue. ?Keep it clean. ?If the sleeve or brace is not waterproof: ?Do not let it get wet. ?Ask your doctor if you may take it off when you take a bath or shower. If not, cover it with a watertight covering. ?Managing pain, stiffness, and swelling ?  ?If told, put heat on your knee. Do this as often as told by your doctor. Use the heat source that your doctor recommends, such as a moist heat pack or a heating pad. ?If you have a removable knee sleeve or brace, take it off as told by your doctor. ?Place a towel between your skin and the heat source. ?Leave the heat on for 20-30 minutes. ?Take off the heat if your skin turns bright red. This is very important. If you cannot feel pain, heat, or cold, you have  a greater risk of getting burned. ?If told, put ice on your knee. To do this: ?If you have a removable knee sleeve or brace, take it off as told by your doctor. ?Put ice in a plastic bag. ?Place a towel between your skin and the bag. ?Leave the ice on for 20 minutes, 2-3 times a day. ?Take off the ice if your skin turns bright red. This is very important. If you cannot feel pain, heat, or cold, you have a greater risk of damage to the area. ?Move your toes often. ?Raise the injured area above the level of your heart while you are sitting or lying down. ?Activity ?Avoid activities where both feet leave the ground at the same time (high-impact activities). Examples are running, jumping rope, and doing jumping jacks. ?Follow the exercise plan that your doctor makes for you. Your doctor may suggest that you: ?Avoid activities that make knee pain worse. You may need to change the exercises that you do, the sports that you participate in, or your job duties. ?Wear shoes with cushioned soles. ?Avoid sports that require running and sudden changes in direction. ?Do exercises or physical therapy. This is planned to match your needs and your abilities. ?Do exercises that increase your balance and strength, such as tai chi and yoga. ?Do not use your injured knee to support your body weight until your doctor says that you  can. Use crutches as told by your doctor. ?Return to your normal activities when your doctor says that it is safe. ?General instructions ?Take over-the-counter and prescription medicines only as told by your doctor. ?If you are overweight, work with your doctor and a food expert (dietitian) to set goals to lose weight. Being overweight can make your knee hurt more. ?Do not smoke or use any products that contain nicotine or tobacco. If you need help quitting, ask your doctor. ?Keep all follow-up visits. ?Contact a doctor if: ?You have knee pain that is not getting better or gets worse. ?You are not able to do  your exercises due to knee pain. ?Get help right away if: ?Your knee swells and the swelling gets worse. ?You cannot move your knee. ?You have very bad knee pain. ?Summary ?Knee pain that lasts more than 3 months is called chronic knee pain. ?The main treatments for chronic knee pain are physical therapy and weight loss. You may also need to take medicines, wear a knee sleeve or brace, use crutches, and put ice or heat on your knee. ?Lose weight if you are overweight. Work with your doctor and a food expert (dietitian) to help you set goals to lose weight. Being overweight can make your knee hurt more. ?Follow the exercise plan that your doctor makes for you. ?This information is not intended to replace advice given to you by your health care provider. Make sure you discuss any questions you have with your health care provider. ?Document Revised: 12/01/2019 Document Reviewed: 12/01/2019 ?Elsevier Patient Education ? 2022 [employer]. ? ? ?

## 2021-09-19 NOTE — Assessment & Plan Note (Signed)
-   DG Knee Complete 4 Views Left ? ?Note: due to time constraints patient will need to return for xray ? ?2. Screening for STD (sexually transmitted disease) ? ?- HSV(herpes simplex vrs) 1+2 ab-IgG ?- HIV antibody (with reflex) ?- Urine cytology ancillary only ? ?Follow up: ? ?Follow up in 4 weeks with Amy or Dr. Andrey Campanile for physical ?
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
## 2021-09-20 LAB — URINE CYTOLOGY ANCILLARY ONLY
Chlamydia: NEGATIVE
Comment: NEGATIVE
Comment: NEGATIVE
Comment: NORMAL
Neisseria Gonorrhea: NEGATIVE
Trichomonas: NEGATIVE

## 2021-09-21 LAB — HSV-2 IGG SUPPLEMENTAL TEST

## 2021-09-21 LAB — HSV(HERPES SIMPLEX VRS) I + II AB-IGG
HSV 1 Glycoprotein G Ab, IgG: 51.6 index — ABNORMAL HIGH (ref 0.00–0.90)
HSV 2 IgG, Type Spec: 3.91 index — ABNORMAL HIGH (ref 0.00–0.90)

## 2021-09-21 LAB — HIV ANTIBODY (ROUTINE TESTING W REFLEX): HIV Screen 4th Generation wRfx: NONREACTIVE

## 2021-09-24 ENCOUNTER — Encounter: Payer: Self-pay | Admitting: Nurse Practitioner

## 2021-10-26 NOTE — Progress Notes (Signed)
Erroneous encounter

## 2021-10-31 ENCOUNTER — Encounter: Payer: Managed Care, Other (non HMO) | Admitting: Family

## 2021-10-31 DIAGNOSIS — Z130 Encounter for screening for diseases of the blood and blood-forming organs and certain disorders involving the immune mechanism: Secondary | ICD-10-CM

## 2021-10-31 DIAGNOSIS — Z131 Encounter for screening for diabetes mellitus: Secondary | ICD-10-CM

## 2021-10-31 DIAGNOSIS — Z1159 Encounter for screening for other viral diseases: Secondary | ICD-10-CM

## 2021-10-31 DIAGNOSIS — Z13228 Encounter for screening for other metabolic disorders: Secondary | ICD-10-CM

## 2021-10-31 DIAGNOSIS — Z0000 Encounter for general adult medical examination without abnormal findings: Secondary | ICD-10-CM

## 2021-10-31 DIAGNOSIS — Z1329 Encounter for screening for other suspected endocrine disorder: Secondary | ICD-10-CM

## 2021-10-31 DIAGNOSIS — Z13220 Encounter for screening for lipoid disorders: Secondary | ICD-10-CM
